# Patient Record
Sex: Female | Born: 1965 | Race: White | Hispanic: No | Marital: Married | Smoking: Never smoker
Health system: Southern US, Community
[De-identification: ages and names within clinical notes are randomized; demographics above are authoritative.]

## PROBLEM LIST (undated history)

## (undated) DIAGNOSIS — I10 Essential (primary) hypertension: Secondary | ICD-10-CM

## (undated) DIAGNOSIS — E785 Hyperlipidemia, unspecified: Secondary | ICD-10-CM

## (undated) DIAGNOSIS — K219 Gastro-esophageal reflux disease without esophagitis: Secondary | ICD-10-CM

## (undated) DIAGNOSIS — I499 Cardiac arrhythmia, unspecified: Secondary | ICD-10-CM

## (undated) DIAGNOSIS — T4145XA Adverse effect of unspecified anesthetic, initial encounter: Secondary | ICD-10-CM

## (undated) DIAGNOSIS — T8859XA Other complications of anesthesia, initial encounter: Secondary | ICD-10-CM

## (undated) HISTORY — DX: Hyperlipidemia, unspecified: E78.5

## (undated) HISTORY — PX: TUBAL LIGATION: SHX77

## (undated) HISTORY — PX: OTHER SURGICAL HISTORY: SHX169

## (undated) HISTORY — DX: Essential (primary) hypertension: I10

## (undated) HISTORY — PX: CARPAL TUNNEL RELEASE: SHX101

## (undated) HISTORY — DX: Gastro-esophageal reflux disease without esophagitis: K21.9

---

## 1988-10-21 HISTORY — PX: OTHER SURGICAL HISTORY: SHX169

## 2007-10-22 HISTORY — PX: BLADDER SUSPENSION: SHX72

## 2008-02-18 ENCOUNTER — Encounter (INDEPENDENT_AMBULATORY_CARE_PROVIDER_SITE_OTHER): Payer: Self-pay | Admitting: Obstetrics and Gynecology

## 2008-02-19 ENCOUNTER — Ambulatory Visit (HOSPITAL_COMMUNITY): Admission: RE | Admit: 2008-02-19 | Discharge: 2008-02-19 | Payer: Self-pay | Admitting: Obstetrics and Gynecology

## 2011-03-05 NOTE — H&P (Signed)
Carrie Brooks, Carrie Brooks            ACCOUNT NO.:  [id number]   MEDICAL RECORD NO.:  [id number]          PATIENT TYPE:  AMB   LOCATION:  SDC                           FACILITY:  [facility]   PHYSICIAN:  Zenaida Niece, M.D.DATE OF BIRTH:  April 29, 1966   DATE OF ADMISSION:  02/19/2008  DATE OF DISCHARGE:                              HISTORY & PHYSICAL   CHIEF COMPLAINT:  Abnormal uterine bleeding and stress urinary  incontinence.   HISTORY AND PHYSICAL:  This is a 45 year old female para 3-0-1-3 who was  referred to me by Verdie Drown.  I first saw her in March of this year.  She complains of recent irregular menses for which she had, in March,  bled for four of the past six weeks with mild to moderate cramping.  She  has tried oral contraceptives in the past and did not tolerate these.  Physical exam did not reveal any significant abnormalities.  She had a  pelvic ultrasound performed which was normal and this included saline  infusion to assess the endometrial cavity.  Endometrial biopsy was  benign but consistent with a possible endometrial polyp.  She also  complains of a urinary urgency and possibly stress urinary incontinence.  Urodynamics were performed in the office which were slightly  inconclusive.  She then saw Dr. Larey Dresser for a consultation. He  did feel that she would benefit from a sling procedure.  This was  discussed with the patient and she would prefer that I perform the  sling.  She is being admitted for hysteroscopy with endometrial ablation  and TVT Secur.   PAST OBSTETRICAL HISTORY:  Three vaginal deliveries at term and one  spontaneous abortion.   PAST MEDICAL HISTORY:  She denies, including no venous thromboembolic  problems.   PAST SURGICAL HISTORY:  1. Bilateral tubal ligation.  2. Cholecystectomy.  3. Elbow surgery.  4. Surgery for carpal tunnel syndrome.   ALLERGIES:  PENICILLIN and KEFLEX.   CURRENT MEDICATIONS:  None.   GYNECOLOGICAL HISTORY:   Remote history of abnormal Pap smear with normal  colposcopy and she had a normal Pap smear in February of this year.  She  denies any sexually-transmitted diseases.   FAMILY HISTORY:  Maternal grandmother with breast cancer.   REVIEW OF SYSTEMS:  No vaginal discharge or lesions, she is sexually  active without problems and has normal bowel function.  No shortness of  breath or chest pain.   PHYSICAL EXAMINATION:  Weight is 232 pounds.  Generally, this is a well-  developed female in no acute distress.  NECK:  Supple without lymphadenopathy or thyromegaly.  LUNGS:  Clear to auscultation.  HEART:  Regular rate and rhythm without murmur.  ABDOMEN:  Soft, nontender, nondistended without palpable masses.  EXTREMITIES:  No edema and are nontender.  PELVIC:  External genitalia is normal.  On speculum exam, the cervix is  normal.  The Pipelle sounded to approximately 9 cm.  On bimanual exam,  she has an anteverted to mid planar uterus that is upper limits of  normal size and nontender and she has no adnexal masses.  ASSESSMENT:  1. Abnormal uterine bleeding.  All nonsurgical and surgical options      have been discussed with the patient.  Ultrasound is normal      although there was possibly a polyp by endometrial biopsy.  She      wishes to proceed with endometrial ablation.  Again, all other      options and risks of surgery have been discussed.  2. Stress urinary incontinence.  My office urodynamics were      inconclusive but Dr. Larey Dresser feels she would benefit from a      sling.  Again this has been discussed with the patient and I      referred for Dr. Vonita Moss to do her sling.  She is electing to have      me due the sling.  Risks of the surgery have been discussed.   PLAN:  Admit the patient on the day of surgery for a hysteroscopy,  endometrial ablation with NovaSure and TVT Secur.      Zenaida Niece, M.D.  Electronically Signed     TDM/MEDQ  D:  02/18/2008   T:  02/18/2008  Job:  [id number]

## 2011-03-05 NOTE — Op Note (Signed)
NAMESERINA, Carrie Brooks            ACCOUNT NO.:  [id number]   MEDICAL RECORD NO.:  [id number]          PATIENT TYPE:  AMB   LOCATION:  SDC                           FACILITY:  [facility]   PHYSICIAN:  Zenaida Niece, M.D.DATE OF BIRTH:  1966/02/22   DATE OF PROCEDURE:  DATE OF DISCHARGE:                               OPERATIVE REPORT   PREOPERATIVE DIAGNOSES:  Abnormal uterine bleeding and stress urinary  incontinence.   POSTOPERATIVE DIAGNOSES:  Abnormal uterine bleeding and stress urinary  incontinence.   PROCEDURE:  Hysteroscopy with D&C, NovaSure endometrial ablation, and  TVT-SECUR.   SURGEON:  Zenaida Niece, MD.   ANESTHESIA:  General endotracheal tube and paracervical block and  vaginal local anesthesia.   FINDINGS:  She had a normal endometrial cavity with some polypoid  endometrium.  The NovaSure device used a cavity length of 4.5 cm, width  of 4.3 cm, and used 106 w for 88 seconds.   SPECIMENS:  Endometrial curettings sent to pathology.   ESTIMATED BLOOD LOSS:  50 mL.   COMPLICATIONS:  None.   PROCEDURE IN DETAIL:  The patient was taken to the operating room and  then placed in the dorsal supine position.  General anesthesia was then  induced.  She was placed in mobile stirrups and legs elevated until her  knees and hips were at 90-degree angles.  Perineum and vagina were then  prepped and draped in the usual sterile fashion and bladder drained with  a latex-free catheter.  A Graves speculum was then inserted into the  vagina.  The anterior lip of the cervix was grasped with a single-tooth  tenaculum.  Paracervical block was then performed with a total of 16 mL  of 2% plain lidocaine.  This was a deep block.  The patient was given  Toradol IV.  Uterus then sounded to 8.5 cm.  The cervix was easily  dilated to a size 19 dilator.  The observer hysteroscope was inserted  and good visualization was achieved.  The uterine fundus appeared  normal, but in the mid body  in the uterus posteriorly there was some  polypoid tissue.  No further lesions were seen.  The hysteroscope was  removed and sharp curettage was performed with return of a small amount  of tissue.  Hysteroscopy was again performed which revealed a normal  endometrial cavity with no significant tissue.  The fluid was evacuated,  and the hysteroscope was removed.  The cervix then measured 4 cm with  the Hegar size 7 dilator.  Cervix was gradually dilated to accommodate a  size 8 Hegar dilator.  The NovaSure device was then inserted and  deployed appropriately.  CO2 test was passed and endometrial ablation  was performed without complications as mentioned above.  The device was  then removed and found to be intact.  The single-tooth tenaculum was  removed and bleeding controlled with pressure.  The Graves speculum was  then removed from the vagina.   Attention was turned to the TVT.  A weighted speculum was inserted into  the vagina.  Allis clamps were used to grab the vaginal  mucosa  approximately 1 fingerbreadth from the urethral meatus in the midline.  This area was then infiltrated with a mixture of 1% plain lidocaine and  0.5% Marcaine with epinephrine.  This was done in the midline and  laterally for hydrodissection along the dissection plane for the TVT.  A  vertical incision was then made in the vaginal mucosa, and the edges  were grasped with the Allis clamps.  Sharp dissection was then used to  dissect the vaginal mucosa laterally to the pubic ramus.  This was done  bilaterally.  A knife handle was able to be passed to the pubic ramus on  each side.  The TVT-SECUR device was then inserted first on the  patient's right side and then on the left side with good insertion.  A  right-angled clamp was able to be passed between the urethra and the  sling, which was well visualized.  Cystoscopy was then performed, which  revealed a normal bladder and no evidence of injury to the bladder  or  the urethra.  Cystoscope was removed.  The needles on the TVT-SECUR  device were then removed without difficulty.  The vaginal incision was  closed with running locking 2-0 Vicryl.  Bleeding from the distal end of  this incision was controlled with figure-of-eight sutures of 2-0 Vicryl.  This achieved good closure and adequate hemostasis.  All instruments  were then removed from the vagina.  The bladder was then drained of  approximately 300 mL of clear urine.  The patient was taken down from  stirrups.  She was extubated in the operating room taken to the recovery  room in stable condition after tolerating the procedure well.  Counts  were correct x2, she received Cipro 200 mg IV at the beginning of the  procedure and had PAS hose on throughout the procedure.      Zenaida Niece, M.D.  Electronically Signed     TDM/MEDQ  D:  02/19/2008  T:  02/19/2008  Job:  [id number]

## 2011-07-16 LAB — CBC
HCT: 40.8
Platelets: 232
WBC: 10

## 2011-07-16 LAB — PREGNANCY, URINE: Preg Test, Ur: NEGATIVE

## 2014-03-22 ENCOUNTER — Ambulatory Visit (INDEPENDENT_AMBULATORY_CARE_PROVIDER_SITE_OTHER): Payer: Self-pay | Admitting: General Surgery

## 2014-03-31 ENCOUNTER — Ambulatory Visit (INDEPENDENT_AMBULATORY_CARE_PROVIDER_SITE_OTHER): Payer: Managed Care, Other (non HMO) | Admitting: General Surgery

## 2014-03-31 ENCOUNTER — Encounter (INDEPENDENT_AMBULATORY_CARE_PROVIDER_SITE_OTHER): Payer: Self-pay | Admitting: General Surgery

## 2014-03-31 DIAGNOSIS — K219 Gastro-esophageal reflux disease without esophagitis: Secondary | ICD-10-CM

## 2014-03-31 DIAGNOSIS — E785 Hyperlipidemia, unspecified: Secondary | ICD-10-CM

## 2014-03-31 DIAGNOSIS — I10 Essential (primary) hypertension: Secondary | ICD-10-CM

## 2014-03-31 NOTE — Progress Notes (Signed)
Subjective:   morbid obesity  Patient ID: Carrie Brooks, female   DOB: Oct 01, 1966, 48 y.o.   MRN: [id number]  HPI Carrie Bellowngela C McMasters48 y.o.female referred by Dr. Tomasa BlaseSchultz for surgical treatment for morbid obesity.  She  gives a history of progressive obesity since early adulthood despite multiple attempts at medical management. She has been able to lose up to 70 pounds at a time in the past but didn't experience is progressive weight regain. In recent years she has had a harder time getting the weight off.  her weight has been affecting her in a number of ways including inability to enjoy routine activities with her family.  She also is concerned about her long-term health as she has developed medical problems related to her weight including hypertension and elevated cholesterol and some evidence of fatty liver..   She has been to our initial information seminar, researched surgical options thoroughly and is interested in sleeve gastrectomy. She does have some mild reflux occasionally controlled with over-the-counter medications.  Past Medical History  Diagnosis Date  . GERD (gastroesophageal reflux disease)   . Hyperlipidemia   . Hypertension    Past Surgical History  Procedure Laterality Date  . Bladder suspension  2009  . Carpal tunnel release Bilateral 2004 & 2005  . Gallbladder  1990  . Tubal ligation     Current Outpatient Prescriptions  Medication Sig Dispense Refill  . carvedilol (COREG) 25 MG tablet       . cetirizine (ZYRTEC) 10 MG tablet Take 10 mg by mouth daily.      . cholecalciferol (VITAMIN D) 1000 UNITS tablet Take 5,000 Units by mouth daily.      . Prenatal Vit-Fe Fumarate-FA (MULTIVITAMIN-PRENATAL) 27-0.8 MG TABS tablet Take 1 tablet by mouth daily at 12 noon.       No current facility-administered medications for this visit.   Allergies  Allergen Reactions  . Penicillins Hives   History  Substance Use Topics  . Smoking status: Never Smoker   . Smokeless  tobacco: Not on file  . Alcohol Use: No       Review of Systems  Constitutional: Positive for fatigue.  Respiratory: Negative.   Cardiovascular: Positive for palpitations. Negative for chest pain and leg swelling.  Gastrointestinal: Negative.   Genitourinary: Negative.   Musculoskeletal: Negative.   Psychiatric/Behavioral: Negative.        Objective:   Physical Exam BP 135/100  Pulse 82  Temp(Src) 98.6 F (37 C)  Resp 16  Ht 5\' 3"  (1.6 m)  Wt 236 lb (107.049 kg)  BMI 41.82 kg/m2 General: Alert, obese Caucasian female, in no distress Skin: Warm and dry without rash or infection. HEENT: No palpable masses or thyromegaly. Sclera nonicteric. Pupils equal round and reactive. Oropharynx clear. Lymph nodes: No cervical, supraclavicular, or inguinal nodes palpable. Lungs: Breath sounds clear and equal without increased work of breathing Cardiovascular: Regular rate and rhythm without murmur. No JVD or edema. Peripheral pulses intact. Abdomen: Nondistended. Soft and nontender. No masses palpable. No organomegaly. No palpable hernias. Well-healed Coker incision without hernias Extremities: No edema or joint swelling or deformity. No chronic venous stasis changes. Neurologic: Alert and fully oriented. Gait normal.    Assessment:     Patient with progressive morbid obesity unresponsive to multiple efforts at medical management who presents with a BMI of 41 and comorbidities of hypertension, elevated cholesterol, steatohepatitis and mild GERD.. I believe there would be very significant medical benefit from surgical weight loss. After our discussion  of surgical options currently available the patient has decided to proceed with sleeve gastrectomy as she is concerned about dumping and malabsorption and has known patients who have done very well with the sleeve.. We have discussed the nature of the problem and the risks of remaining morbidly obese. We discussed laparoscopic sleeve gastric in  detail including the nature of the procedure, expected hospitalization and recovery, and major risks of anesthetic complications, bleeding, blood clots and pulmonary emboli, leakage and infection and long-term risks of stricture, ulceration, bowel obstruction, nausea and the possibility that her reflux could be made worse., and failure to lose weight or weight regain. We discussed these problems could lead to death. The patient was given a complete consent form to review and all questions were answered. We will go ahead with preoperative including psychological and nutrition evaluations, upper GI series and routine lab and x-rays. I will see the patient back following these studies.    Plan:     As above

## 2014-03-31 NOTE — Addendum Note (Signed)
Addended by: Maryan Puls on: 03/31/2014 12:07 PM   Modules accepted: Orders

## 2014-04-07 ENCOUNTER — Ambulatory Visit (INDEPENDENT_AMBULATORY_CARE_PROVIDER_SITE_OTHER): Payer: Self-pay | Admitting: General Surgery

## 2014-04-30 ENCOUNTER — Encounter: Payer: Self-pay | Admitting: Dietician

## 2014-04-30 ENCOUNTER — Encounter: Payer: Managed Care, Other (non HMO) | Attending: General Surgery | Admitting: Dietician

## 2014-04-30 DIAGNOSIS — Z713 Dietary counseling and surveillance: Secondary | ICD-10-CM | POA: Insufficient documentation

## 2014-04-30 DIAGNOSIS — Z01818 Encounter for other preprocedural examination: Secondary | ICD-10-CM | POA: Insufficient documentation

## 2014-04-30 NOTE — Progress Notes (Signed)
  Pre-Op Assessment Visit:  Pre-Operative Sleeve Gastrectomy Surgery  Medical Nutrition Therapy:  Appt start time: 1115   End time:  1145.  Patient was seen on 04/30/2014 for Pre-Operative Sleeve Gastrectomy Nutrition Assessment. Assessment [facility] on 04/30/2014.   Preferred Learning Style:   No preference indicated   Learning Readiness:   Ready  Handouts given during visit include:  Pre-Op Goals Bariatric Surgery Protein Shakes  Teaching Method Utilized:  Visual Auditory Hands on  Barriers to learning/adherence to lifestyle change: none  Demonstrated degree of understanding via:  Teach Back   Patient to call the [facility] to enroll in Pre-Op and Post-Op Nutrition Education when surgery date is scheduled.

## 2014-04-30 NOTE — Patient Instructions (Signed)
Follow Pre-Op Goals Try Protein Shakes  

## 2014-05-03 ENCOUNTER — Encounter (INDEPENDENT_AMBULATORY_CARE_PROVIDER_SITE_OTHER): Payer: Self-pay

## 2014-05-03 ENCOUNTER — Ambulatory Visit (HOSPITAL_COMMUNITY)
Admission: RE | Admit: 2014-05-03 | Discharge: 2014-05-03 | Disposition: A | Discharge status: Home / Self Care | Payer: Managed Care, Other (non HMO) | Source: Ambulatory Visit | Attending: General Surgery | Admitting: General Surgery

## 2014-05-03 ENCOUNTER — Other Ambulatory Visit: Payer: Self-pay

## 2014-05-03 DIAGNOSIS — J984 Other disorders of lung: Secondary | ICD-10-CM | POA: Insufficient documentation

## 2014-05-03 DIAGNOSIS — E785 Hyperlipidemia, unspecified: Secondary | ICD-10-CM | POA: Insufficient documentation

## 2014-05-03 DIAGNOSIS — K219 Gastro-esophageal reflux disease without esophagitis: Secondary | ICD-10-CM | POA: Insufficient documentation

## 2014-05-03 DIAGNOSIS — K449 Diaphragmatic hernia without obstruction or gangrene: Secondary | ICD-10-CM | POA: Insufficient documentation

## 2014-05-03 DIAGNOSIS — I10 Essential (primary) hypertension: Secondary | ICD-10-CM

## 2014-05-03 DIAGNOSIS — Z9089 Acquired absence of other organs: Secondary | ICD-10-CM | POA: Insufficient documentation

## 2014-05-03 DIAGNOSIS — Z6841 Body Mass Index (BMI) 40.0 and over, adult: Secondary | ICD-10-CM | POA: Insufficient documentation

## 2014-05-03 DIAGNOSIS — R918 Other nonspecific abnormal finding of lung field: Secondary | ICD-10-CM | POA: Insufficient documentation

## 2014-05-04 NOTE — Progress Notes (Signed)
  Pre-Operative Nutrition Class:  Appt start time: 9539   End time:  1830.  Patient was seen on 05/02/14 for Pre-Operative Bariatric Surgery Education at the [facility].   Surgery date:  Surgery type: Gastric sleeve Start weight at [facility]: 242 lbs on 04/30/2014 Weight today: 241.5 lbs  TANITA  BODY COMP RESULTS  05/02/14   BMI (kg/m^2) 42.8   Fat Mass (lbs) 118   Fat Free Mass (lbs) 123.5   Total Body Water (lbs) 90.5   Samples given per MNT protocol. Patient educated on appropriate usage: Premier protein shake (strawberry - qty 1) Lot #: [id number] Exp: 02/2015  Bariatric Advantage Calcium Citrate (chocolate - qty 1) Lot #: [id number] Exp: 03/2015  Celebrate Vitamins Multivitamin (orange - qty 1) Lot #: [id number] Exp: 11/2014  Renee Pain Protein Powder (vanilla - qty 1) Lot #: [id number] Exp: 07/2015  The following the learning objectives were met by the patient during this course:  Identify Pre-Op Dietary Goals and will begin 2 weeks pre-operatively  Identify appropriate sources of fluids and proteins   State protein recommendations and appropriate sources pre and post-operatively  Identify Post-Operative Dietary Goals and will follow for 2 weeks post-operatively  Identify appropriate multivitamin and calcium sources  Describe the need for physical activity post-operatively and will follow MD recommendations  State when to call healthcare provider regarding medication questions or post-operative complications  Handouts given during class include:  Pre-Op Bariatric Surgery Diet Handout  Protein Shake Handout  Post-Op Bariatric Surgery Nutrition Handout  BELT Program Information Flyer  Support Group Information Flyer  [facility] Bariatric Supplements Price List  Follow-Up Plan: Patient will follow-up at [facility] 2 weeks post operatively for diet advancement per MD.

## 2014-07-01 ENCOUNTER — Encounter (HOSPITAL_COMMUNITY): Payer: Self-pay | Admitting: Pharmacy Technician

## 2014-07-01 NOTE — Progress Notes (Signed)
Please put orders in Epic surgery 07-12-14 pre op 07-08-14 Thanks

## 2014-07-04 NOTE — Progress Notes (Signed)
Please put orders in Epic surgery 07-12-14 pre op 07-08-14 Thanks 

## 2014-07-06 NOTE — Progress Notes (Signed)
Please place orders in EPIC.  Surgery on 07/12/2014.  Preop on 07/08/14 at 0930am.  Thank You.

## 2014-07-06 NOTE — Patient Instructions (Addendum)
Carrie Brooks  07/06/2014   Your procedure is scheduled on:  07/12/2014    Report to [facility].  Follow the Signs to Short Stay Center at    1145    am  Call this number if you have problems the morning of surgery: [phone]   Remember:   Do not eat food after midnite. May have clear liquids until 0700am then npo.    Take these medicines the morning of surgery with A SIP OF WATER: coreg, Zyrtec    Do not wear jewelry, make-up or nail polish.  Do not wear lotions, powders, or perfumes, deodorant.  Do not shave 48 hours prior to surgery.   Do not bring valuables to the hospital.  Contacts, dentures or bridgework may not be worn into surgery.  Leave suitcase in the car. After surgery it may be brought to your room.  For patients admitted to the hospital, checkout time is 11:00 AM the day of  discharge.        Please read over the following fact sheets that you were given:   CLEAR LIQUID DIET   Foods Allowed                                                                     Foods Excluded  Coffee and tea, regular and decaf                             liquids that you cannot  Plain Jell-O in any flavor                                             see through such as: Fruit ices (not with fruit pulp)                                     milk, soups, orange juice  Iced Popsicles                                    All solid food Carbonated beverages, regular and diet                                    Cranberry, grape and apple juices Sports drinks like Gatorade Lightly seasoned clear broth or consume(fat free) Sugar, honey syrup  Sample Menu Breakfast                                Lunch                                     Supper Cranberry juice                    Beef  broth                            Chicken broth Jell-O                                     Grape juice                           Apple juice Coffee or tea                        Jell-O                                       Popsicle                                                Coffee or tea                        Coffee or tea  _____________________________________________________________________  [facility] Before surgery, you can play an important role.  Because skin is not sterile, your skin needs to be as free of germs as possible.  You can reduce the number of germs on your skin by washing with CHG (chlorahexidine gluconate) soap before surgery.  CHG is an antiseptic cleaner which kills germs and bonds with the skin to continue killing germs even after washing. Please DO NOT use if you have an allergy to CHG or antibacterial soaps.  If your skin becomes reddened/irritated stop using the CHG and inform your nurse when you arrive at Short Stay. Do not shave (including legs and underarms) for at least 48 hours prior to the first CHG shower.  You may shave your face/neck. Please follow these instructions carefully:  1.  Shower with CHG Soap the night before surgery and the  morning of Surgery.  2.  If you choose to wash your hair, wash your hair first as usual with your  normal  shampoo.  3.  After you shampoo, rinse your hair and body thoroughly to remove the  shampoo.                           4.  Use CHG as you would any other liquid soap.  You can apply chg directly  to the skin and wash                       Gently with a scrungie or clean washcloth.  5.  Apply the CHG Soap to your body ONLY FROM THE NECK DOWN.   Do not use on face/ open                           Wound or open sores. Avoid contact with eyes, ears mouth and genitals (private parts).                       Wash face,  Genitals (  private parts) with your normal soap.             6.  Wash thoroughly, paying special attention to the area where your surgery  will be performed.  7.  Thoroughly rinse your body with warm water from the neck down.  8.  DO NOT shower/wash with your normal soap after using  and rinsing off  the CHG Soap.                9.  Pat yourself dry with a clean towel.            10.  Wear clean pajamas.            11.  Place clean sheets on your bed the night of your first shower and do not  sleep with pets. Day of Surgery : Do not apply any lotions/deodorants the morning of surgery.  Please wear clean clothes to the hospital/surgery center.  FAILURE TO FOLLOW THESE INSTRUCTIONS MAY RESULT IN THE CANCELLATION OF YOUR SURGERY PATIENT SIGNATURE_________________________________  NURSE SIGNATURE__________________________________  ________________________________________________________________________ , coughing and deep breathing exercises, leg exercises

## 2014-07-08 ENCOUNTER — Encounter (HOSPITAL_COMMUNITY): Payer: Self-pay

## 2014-07-08 ENCOUNTER — Encounter (HOSPITAL_COMMUNITY)
Admission: RE | Admit: 2014-07-08 | Discharge: 2014-07-08 | Disposition: A | Discharge status: Home / Self Care | Payer: Managed Care, Other (non HMO) | Source: Ambulatory Visit | Attending: General Surgery | Admitting: General Surgery

## 2014-07-08 ENCOUNTER — Encounter (INDEPENDENT_AMBULATORY_CARE_PROVIDER_SITE_OTHER): Payer: Self-pay

## 2014-07-08 ENCOUNTER — Other Ambulatory Visit (INDEPENDENT_AMBULATORY_CARE_PROVIDER_SITE_OTHER): Payer: Self-pay | Admitting: General Surgery

## 2014-07-08 DIAGNOSIS — Z01812 Encounter for preprocedural laboratory examination: Secondary | ICD-10-CM | POA: Diagnosis present

## 2014-07-08 DIAGNOSIS — Z6841 Body Mass Index (BMI) 40.0 and over, adult: Secondary | ICD-10-CM | POA: Diagnosis not present

## 2014-07-08 HISTORY — DX: Other complications of anesthesia, initial encounter: T88.59XA

## 2014-07-08 HISTORY — DX: Cardiac arrhythmia, unspecified: I49.9

## 2014-07-08 HISTORY — DX: Adverse effect of unspecified anesthetic, initial encounter: T41.45XA

## 2014-07-08 LAB — CBC
HEMATOCRIT: 41.6 % (ref 36.0–46.0)
Hemoglobin: 13.6 g/dL (ref 12.0–15.0)
MCH: 28.2 pg (ref 26.0–34.0)
MCHC: 32.7 g/dL (ref 30.0–36.0)
MCV: 86.3 fL (ref 78.0–100.0)
Platelets: 234 10*3/uL (ref 150–400)
RBC: 4.82 MIL/uL (ref 3.87–5.11)
RDW: 12.9 % (ref 11.5–15.5)
WBC: 8.2 10*3/uL (ref 4.0–10.5)

## 2014-07-08 LAB — BASIC METABOLIC PANEL
ANION GAP: 13 (ref 5–15)
BUN: 18 mg/dL (ref 6–23)
CO2: 24 mEq/L (ref 19–32)
CREATININE: 0.61 mg/dL (ref 0.50–1.10)
Calcium: 9.5 mg/dL (ref 8.4–10.5)
Chloride: 103 mEq/L (ref 96–112)
GFR calc Af Amer: >90 mL/min (ref >90)
GFR calc non Af Amer: >90 mL/min (ref >90)
Glucose, Bld: 101 mg/dL — ABNORMAL HIGH (ref 70–99)
Potassium: 4.2 mEq/L (ref 3.7–5.3)
Sodium: 140 mEq/L (ref 137–147)

## 2014-07-08 LAB — HCG, SERUM, QUALITATIVE: Preg, Serum: NEGATIVE

## 2014-07-08 NOTE — Progress Notes (Signed)
Holter monitor report on chart- 09/2013 on chart- unremarkable.   Office visit notes- 04/27/2013 and 05/28/2014 on chart from PCP- Dr Tomasa Blase.

## 2014-07-08 NOTE — H&P (Signed)
History of Present Illness Carrie Salina T. Carrie Narez MD; 07/08/2014 12:02 PM) Patient words: Pre op exam, gastric sleeve 07/12/2014 @ [facility].  The patient is a 48 year old female who presents with obesity. Patient with progressive morbid obesity unresponsive to multiple efforts at medical management who presents with a BMI of 41 and comorbidities of hypertension, elevated cholesterol, steatohepatitis and mild GERD. She returns for preop visit prior to planned laparoscopic sleeve gastrectomy.She has successfully completed her preoperative workup. No concerns on psychological or nutrition evaluation. Lab work was unremarkable. Upper GI series showed a "tiny" hiatal hernia but I discussed with her we would look for during surgery and repair if it appeared significant. Other Problems Carrie Brooks; 07/08/2014 11:26 AM) Bladder Problems Cholelithiasis Hypercholesterolemia  Past Surgical History Carrie Brooks; 07/08/2014 11:26 AM) Gallbladder Surgery - Open  Diagnostic Studies History Carrie Brooks; 07/08/2014 11:26 AM) Colonoscopy never Mammogram 1-3 years ago Pap Smear 1-5 years ago  Allergies Carrie Brooks; 07/08/2014 11:28 AM) Cephalosporins Penicillins  Medication History Carrie Brooks; 07/08/2014 11:30 AM) Carvedilol (  Tablet, Oral daily) Active. ALPRAZolam (0.25MG  Tablet, Oral as needed) Active. Prenatal Formula (Oral daily) Active.  Social History Carrie Brooks; 07/08/2014 11:26 AM) Caffeine use Coffee, Tea. No alcohol use No drug use Tobacco use Never smoker.  Family History Carrie Brooks; 07/08/2014 11:26 AM) Arthritis Mother. Bleeding disorder Mother. Cerebrovascular Accident Mother. Colon Polyps Mother. Diabetes Mellitus Sister. Hypertension Father, Mother, Sister.  Pregnancy / Birth History Carrie Brooks; 07/08/2014 11:26 AM) Age at menarche 14 years. Gravida 4 Maternal age 54-20 Para 3     Review of Systems Carrie Brooks; 07/08/2014 11:26 AM) General Not Present- Appetite Loss, Chills, Fatigue, Fever, Night Sweats, Weight Gain and Weight Loss. Skin Not Present- Change in Wart/Brooks, Dryness, Hives, Jaundice, New Lesions, Non-Healing Wounds, Rash and Ulcer. HEENT Present- Seasonal Allergies. Not Present- Earache, Hearing Loss, Hoarseness, Nose Bleed, Oral Ulcers, Ringing in the Ears, Sinus Pain, Sore Throat, Visual Disturbances, Wears glasses/contact lenses and Yellow Eyes. Respiratory Not Present- Bloody sputum, Chronic Cough, Difficulty Breathing, Snoring and Wheezing. Breast Not Present- Breast Mass, Breast Pain, Nipple Discharge and Skin Changes. Cardiovascular Not Present- Chest Pain, Difficulty Breathing Lying Down, Leg Cramps, Palpitations, Rapid Heart Rate, Shortness of Breath and Swelling of Extremities. Gastrointestinal Not Present- Abdominal Pain, Bloating, Bloody Stool, Change in Bowel Habits, Chronic diarrhea, Constipation, Difficulty Swallowing, Excessive gas, Gets full quickly at meals, Hemorrhoids, Indigestion, Nausea, Rectal Pain and Vomiting. Female Genitourinary Not Present- Frequency, Nocturia, Painful Urination, Pelvic Pain and Urgency. Musculoskeletal Not Present- Back Pain, Joint Pain, Joint Stiffness, Muscle Pain, Muscle Weakness and Swelling of Extremities. Neurological Not Present- Decreased Memory, Fainting, Headaches, Numbness, Seizures, Tingling, Tremor, Trouble walking and Weakness. Psychiatric Not Present- Anxiety, Bipolar, Change in Sleep Pattern, Depression, Fearful and Frequent crying. Endocrine Not Present- Cold Intolerance, Excessive Hunger, Hair Changes, Heat Intolerance, Hot flashes and New Diabetes. Hematology Not Present- Easy Bruising, Excessive bleeding, Gland problems, HIV and Persistent Infections.  Vitals Carrie Contras Carrie Brooks; 07/08/2014 11:27 AM) 07/08/2014 11:27 AM Weight: 232.38 lb Height: 63in Body Surface Area: 2.16 m Body Mass Index: 41.16 kg/m Temp.:  98.80F(Oral)  Pulse: 84 (Regular)  Resp.: 16 (Unlabored)  BP: 136/82 (Sitting, Left Arm, Standard)     Physical Exam Carrie Salina T. Carrie Mole MD; 07/08/2014 12:03 PM)  The physical exam findings are as follows: Note:General: Alert, Morbidly obese Caucasian female, in no distress Skin: Warm and dry without rash or infection. HEENT: No palpable masses or thyromegaly. Sclera nonicteric. Pupils equal round and reactive. Lymph nodes: No  cervical, supraclavicular, or inguinal nodes palpable. Lungs: Breath sounds clear and equal. No wheezing or increased work of breathing. Cardiovascular: Regular rate and rhythm without murmer. No JVD or edema. Peripheral pulses intact. No carotid bruits. Abdomen: Nondistended. Soft and nontender. No masses palpable. No organomegaly. No palpable hernias. Extremities: No edema or joint swelling or deformity. No chronic venous stasis changes. Neurologic: Alert and fully oriented. Gait normal. No focal weakness. Psychiatric: Normal mood and affect. Thought content appropriate with normal judgement and insight    Assessment & Plan Carrie Salina T. Carrie Klett MD; 07/08/2014 12:04 PM)  MORBID OBESITY (278.01  E66.01) Impression: ready to proceed with planned laparoscopic sleeve gastrectomy. She is given a prescription for postoperative pain medication. We reviewed the procedure again in detail and consent form and all her questions were answered.  Current Plans Started OxyCODONE HCl /5ML, 5-10 Milliliter every four hours, as needed, 200 Milliliter, 07/08/2014, No Refill.

## 2014-07-08 NOTE — Progress Notes (Signed)
Requested by fax from office of Dr Tomasa Blase- LOV and holter monitor report.

## 2014-07-11 MED ORDER — LEVOFLOXACIN IN D5W 750 MG/150ML IV SOLN
750.0000 mg | INTRAVENOUS | Status: DC
  Filled 2014-07-11: qty 150

## 2014-07-12 ENCOUNTER — Encounter (HOSPITAL_COMMUNITY)
Admission: RE | Disposition: A | Discharge status: Home / Self Care | Payer: Self-pay | Source: Ambulatory Visit | Attending: General Surgery

## 2014-07-12 ENCOUNTER — Encounter (HOSPITAL_COMMUNITY): Payer: Self-pay | Admitting: Anesthesiology

## 2014-07-12 ENCOUNTER — Ambulatory Visit (HOSPITAL_COMMUNITY)
Admission: RE | Admit: 2014-07-12 | Discharge: 2014-07-12 | Disposition: A | Discharge status: Home / Self Care | Payer: Managed Care, Other (non HMO) | Source: Ambulatory Visit | Attending: General Surgery | Admitting: General Surgery

## 2014-07-12 DIAGNOSIS — K7689 Other specified diseases of liver: Secondary | ICD-10-CM | POA: Diagnosis not present

## 2014-07-12 DIAGNOSIS — I10 Essential (primary) hypertension: Secondary | ICD-10-CM | POA: Diagnosis not present

## 2014-07-12 DIAGNOSIS — Z880 Allergy status to penicillin: Secondary | ICD-10-CM | POA: Diagnosis not present

## 2014-07-12 DIAGNOSIS — E7800 Pure hypercholesterolemia, unspecified: Secondary | ICD-10-CM | POA: Diagnosis not present

## 2014-07-12 DIAGNOSIS — Z538 Procedure and treatment not carried out for other reasons: Secondary | ICD-10-CM | POA: Insufficient documentation

## 2014-07-12 DIAGNOSIS — K219 Gastro-esophageal reflux disease without esophagitis: Secondary | ICD-10-CM | POA: Insufficient documentation

## 2014-07-12 DIAGNOSIS — Z6841 Body Mass Index (BMI) 40.0 and over, adult: Secondary | ICD-10-CM | POA: Insufficient documentation

## 2014-07-12 SURGERY — GASTRECTOMY, SLEEVE, LAPAROSCOPIC
Anesthesia: General

## 2014-07-12 MED ORDER — PROPOFOL 10 MG/ML IV BOLUS
INTRAVENOUS | Status: AC
  Filled 2014-07-12: qty 20

## 2014-07-12 MED ORDER — FENTANYL CITRATE 0.05 MG/ML IJ SOLN
INTRAMUSCULAR | Status: AC
  Filled 2014-07-12: qty 5

## 2014-07-12 MED ORDER — NEOSTIGMINE METHYLSULFATE 10 MG/10ML IV SOLN
INTRAVENOUS | Status: AC
  Filled 2014-07-12: qty 1

## 2014-07-12 MED ORDER — HEPARIN SODIUM (PORCINE) 5000 UNIT/ML IJ SOLN
5000.0000 [IU] | INTRAMUSCULAR | Status: DC
  Filled 2014-07-12: qty 1

## 2014-07-12 MED ORDER — GLYCOPYRROLATE 0.2 MG/ML IJ SOLN
INTRAMUSCULAR | Status: AC
  Filled 2014-07-12: qty 3

## 2014-07-12 MED ORDER — CHLORHEXIDINE GLUCONATE CLOTH 2 % EX PADS: 6.0000 | MEDICATED_PAD | Freq: Once | CUTANEOUS | Status: DC

## 2014-07-12 MED ORDER — SCOPOLAMINE 1 MG/3DAYS TD PT72
1.0000 | MEDICATED_PATCH | TRANSDERMAL | Status: DC
  Filled 2014-07-12: qty 1

## 2014-07-12 MED ORDER — MIDAZOLAM HCL 2 MG/2ML IJ SOLN
INTRAMUSCULAR | Status: AC
  Filled 2014-07-12: qty 2

## 2014-07-12 MED ORDER — DEXAMETHASONE SODIUM PHOSPHATE 10 MG/ML IJ SOLN
INTRAMUSCULAR | Status: AC
  Filled 2014-07-12: qty 1

## 2014-07-12 MED ORDER — ONDANSETRON HCL 4 MG/2ML IJ SOLN
INTRAMUSCULAR | Status: AC
  Filled 2014-07-12: qty 2

## 2014-07-12 MED ORDER — CISATRACURIUM BESYLATE 20 MG/10ML IV SOLN
INTRAVENOUS | Status: AC
  Filled 2014-07-12: qty 10

## 2014-07-12 NOTE — Anesthesia Preprocedure Evaluation (Deleted)
Anesthesia Evaluation  Patient identified by MRN, date of birth, ID band Patient awake    Reviewed: Allergy & Precautions, H&P , NPO status , Patient's Chart, lab work & pertinent test results  Airway       Dental   Pulmonary          Cardiovascular hypertension, Pt. on medications  EKG wnl   Neuro/Psych    GI/Hepatic GERD-  Controlled,  Endo/Other    Renal/GU      Musculoskeletal   Abdominal   Peds  Hematology   Anesthesia Other Findings   Reproductive/Obstetrics PG neg 4 days ago                         Anesthesia Physical Anesthesia Plan  ASA: III  Anesthesia Plan: General   Post-op Pain Management:    Induction: Intravenous  Airway Management Planned: Oral ETT  Additional Equipment:   Intra-op Plan:   Post-operative Plan:   Informed Consent: I have reviewed the patients History and Physical, chart, labs and discussed the procedure including the risks, benefits and alternatives for the proposed anesthesia with the patient or authorized representative who has indicated his/her understanding and acceptance.     Plan Discussed with:   Anesthesia Plan Comments: (Check hepatic panel if done, multinodal pain rx (tylenol, precedex, toradol, decadron,lidocaine, ketamine ?))        Anesthesia Quick Evaluation

## 2014-07-12 NOTE — Progress Notes (Signed)
Patient came in crying and extremely apprehensive. Stated she wasn't sure if she was going to stay and have surgery or not. Obtained VS and gave patient a moment to think about it. Made her aware we weren't going to make her stay and do the surgery. Explained this was  A major life change and she needed to feel comfortable with it. After some thought, and more crying she decided she wasn't going to stay.

## 2014-07-26 ENCOUNTER — Ambulatory Visit: Payer: Managed Care, Other (non HMO)

## 2014-11-03 ENCOUNTER — Encounter (HOSPITAL_COMMUNITY): Payer: Self-pay | Admitting: General Surgery

## 2016-02-11 IMAGING — CR DG CHEST 2V
2 series · 2 of 2 positions shown · non-contrast
Comparison: 01/26/2008

CLINICAL DATA: Preop weight loss surgery.

EXAM:
CHEST  2 VIEW

[view not recorded (1 of 2)]
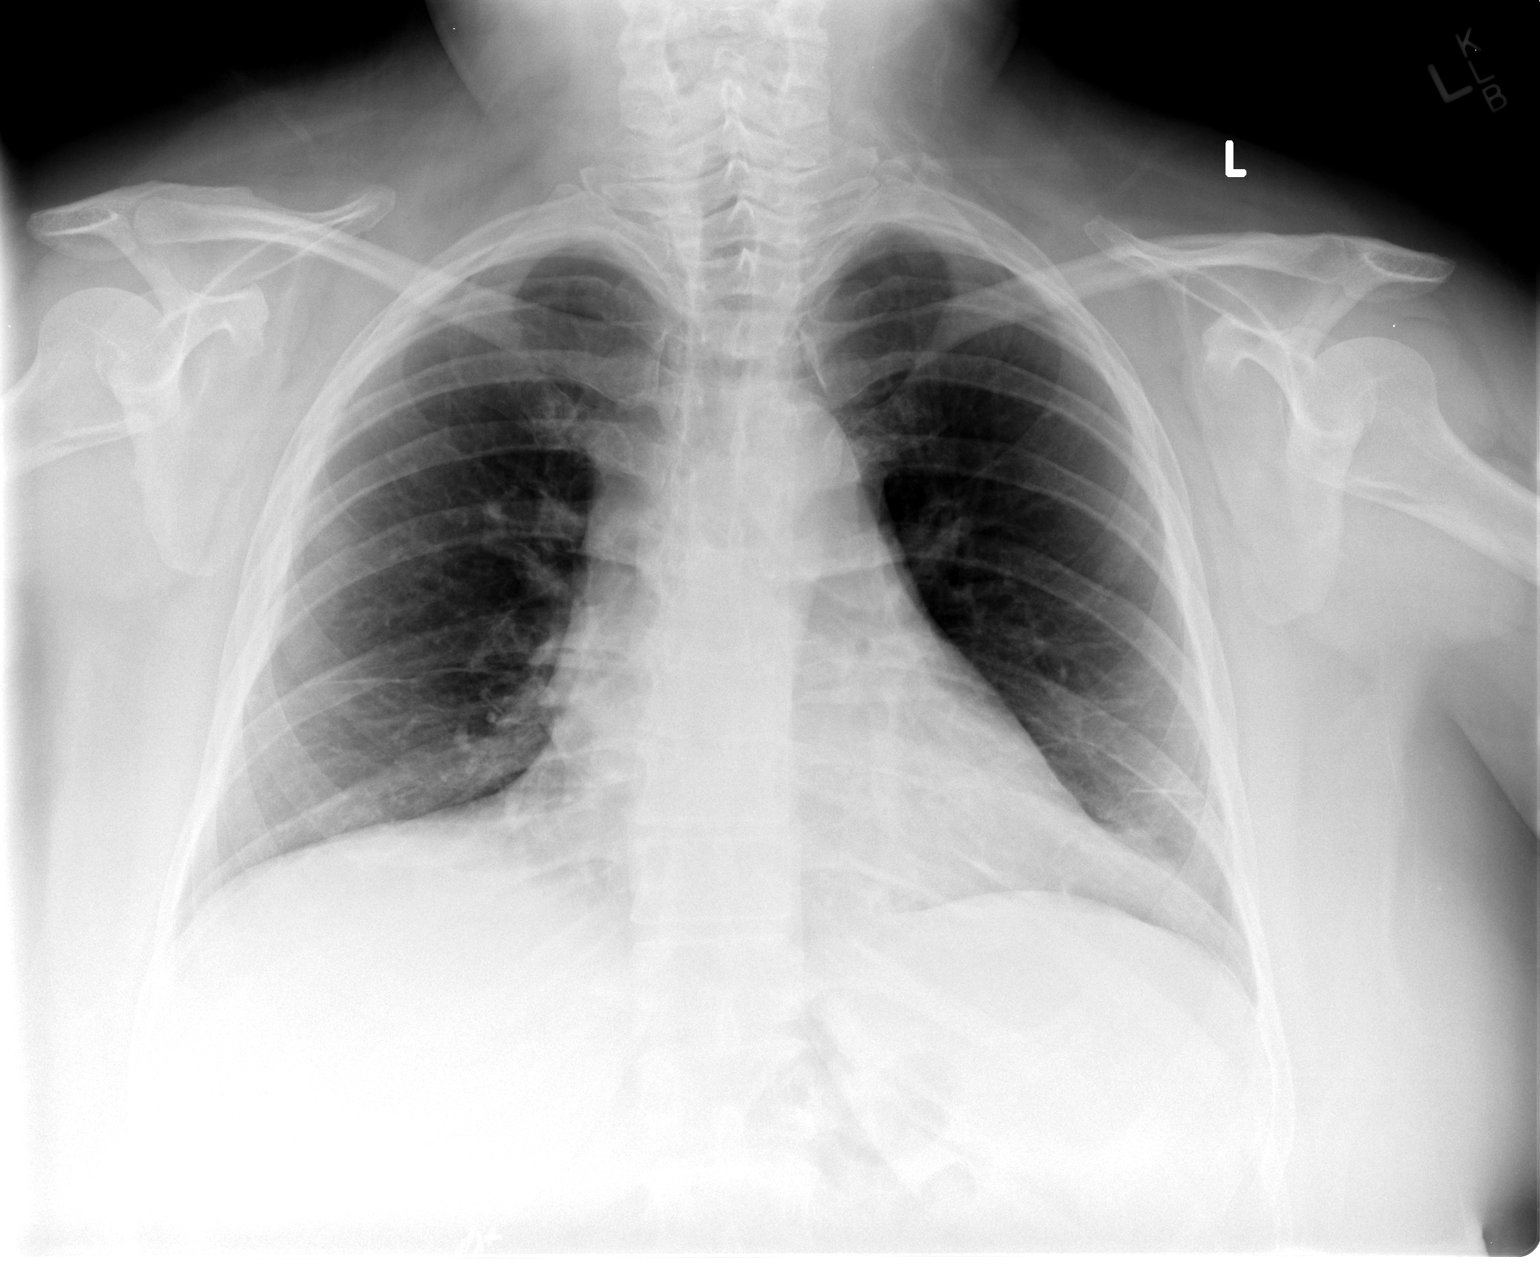

[view not recorded (2 of 2)]
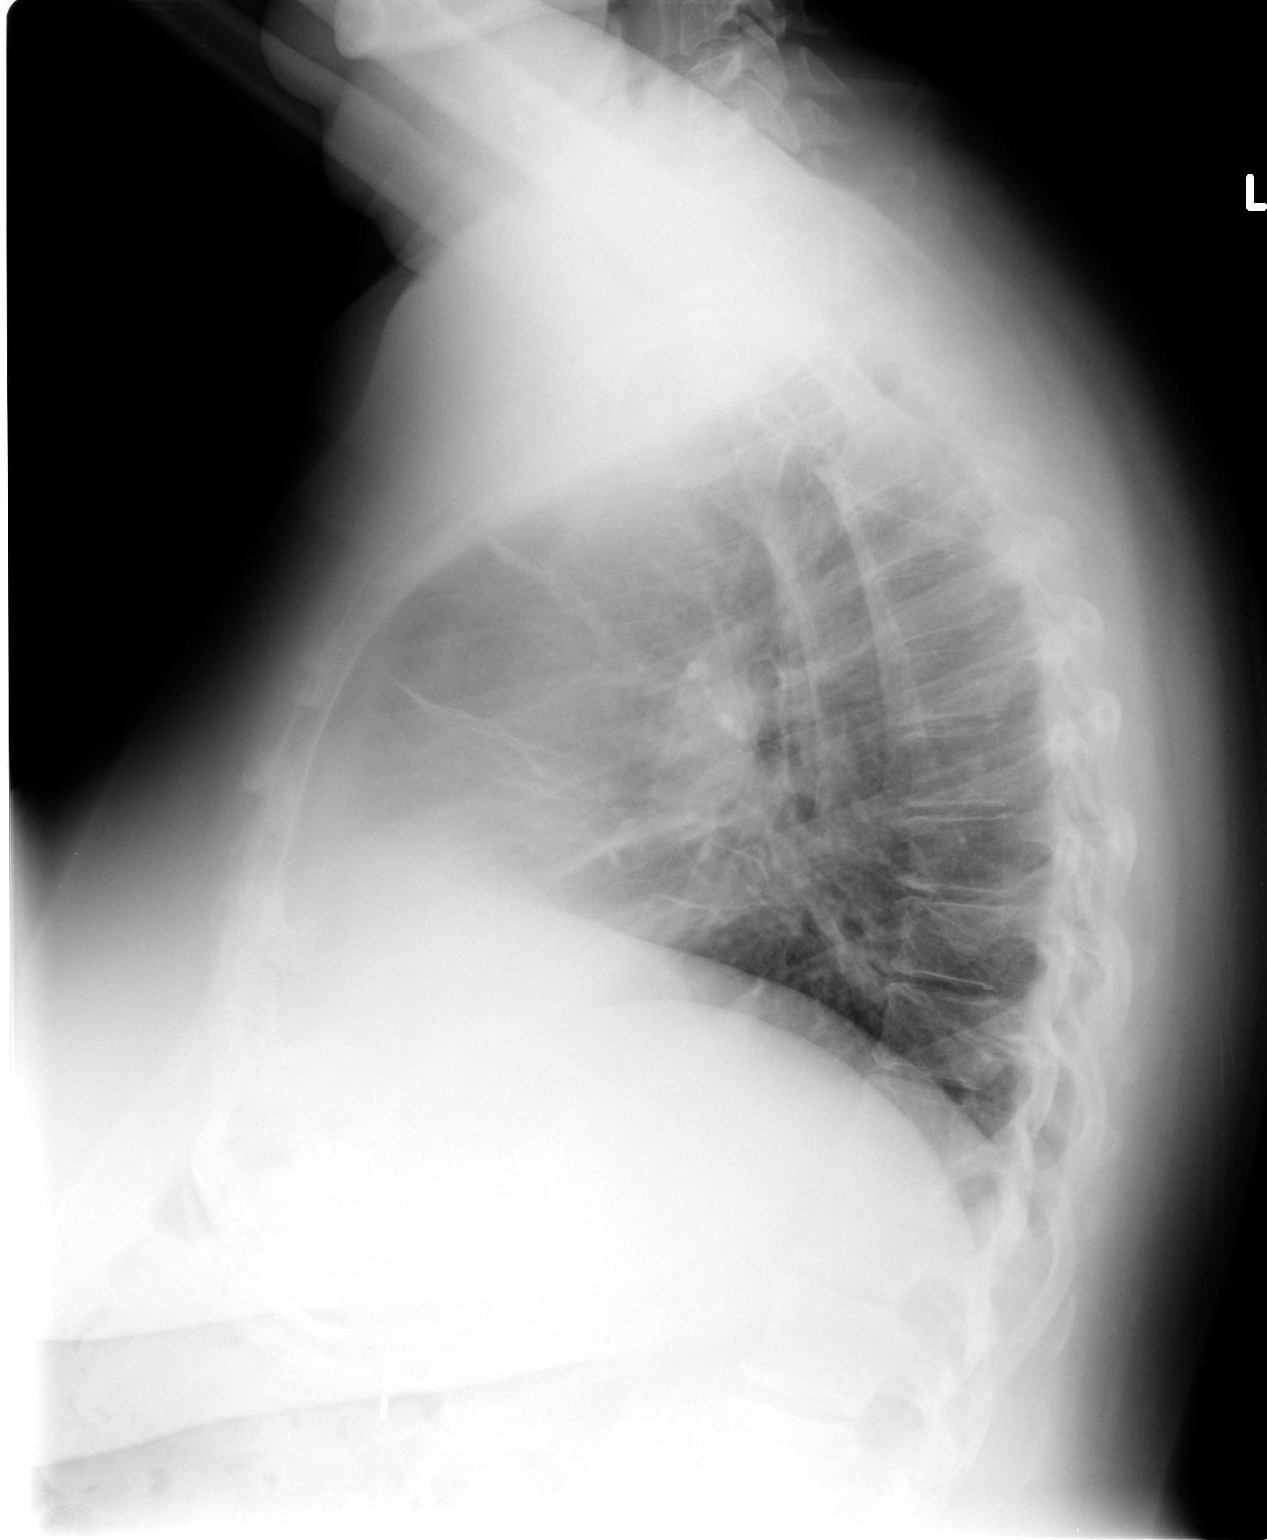

[2 of 2 positions shown; findings below may reference images not displayed]

FINDINGS: Lungs are hypoinflated effusion. Minimal linear scarring is stable.
Cardiomediastinal silhouette and remainder of the exam is unchanged.
IMPRESSION: Hypoinflation without acute cardiopulmonary disease.

## 2018-05-11 ENCOUNTER — Encounter: Payer: Self-pay | Admitting: Gastroenterology

## 2018-06-16 ENCOUNTER — Encounter: Payer: Self-pay | Admitting: Gastroenterology

## 2018-06-26 ENCOUNTER — Encounter

## 2018-06-26 ENCOUNTER — Ambulatory Visit: Payer: Managed Care, Other (non HMO) | Admitting: Gastroenterology

## 2021-04-26 DIAGNOSIS — B9689 Other specified bacterial agents as the cause of diseases classified elsewhere: Secondary | ICD-10-CM | POA: Diagnosis not present

## 2021-04-26 DIAGNOSIS — J019 Acute sinusitis, unspecified: Secondary | ICD-10-CM | POA: Diagnosis not present

## 2021-04-26 DIAGNOSIS — J309 Allergic rhinitis, unspecified: Secondary | ICD-10-CM | POA: Diagnosis not present

## 2021-06-19 DIAGNOSIS — Z6826 Body mass index (BMI) 26.0-26.9, adult: Secondary | ICD-10-CM | POA: Diagnosis not present

## 2021-06-19 DIAGNOSIS — R300 Dysuria: Secondary | ICD-10-CM | POA: Diagnosis not present

## 2021-06-19 DIAGNOSIS — N390 Urinary tract infection, site not specified: Secondary | ICD-10-CM | POA: Diagnosis not present

## 2021-07-02 DIAGNOSIS — K909 Intestinal malabsorption, unspecified: Secondary | ICD-10-CM | POA: Diagnosis not present

## 2021-07-02 DIAGNOSIS — R112 Nausea with vomiting, unspecified: Secondary | ICD-10-CM | POA: Diagnosis not present

## 2021-07-02 DIAGNOSIS — Z9884 Bariatric surgery status: Secondary | ICD-10-CM | POA: Diagnosis not present

## 2021-07-19 DIAGNOSIS — Z9884 Bariatric surgery status: Secondary | ICD-10-CM | POA: Diagnosis not present

## 2021-07-19 DIAGNOSIS — K909 Intestinal malabsorption, unspecified: Secondary | ICD-10-CM | POA: Diagnosis not present

## 2021-07-31 DIAGNOSIS — L720 Epidermal cyst: Secondary | ICD-10-CM | POA: Diagnosis not present

## 2021-07-31 DIAGNOSIS — L820 Inflamed seborrheic keratosis: Secondary | ICD-10-CM | POA: Diagnosis not present

## 2021-10-10 DIAGNOSIS — E65 Localized adiposity: Secondary | ICD-10-CM | POA: Diagnosis not present

## 2021-10-10 DIAGNOSIS — M793 Panniculitis, unspecified: Secondary | ICD-10-CM | POA: Diagnosis not present

## 2021-10-10 DIAGNOSIS — R634 Abnormal weight loss: Secondary | ICD-10-CM | POA: Diagnosis not present

## 2021-10-10 DIAGNOSIS — N6481 Ptosis of breast: Secondary | ICD-10-CM | POA: Diagnosis not present

## 2021-11-19 DIAGNOSIS — R0981 Nasal congestion: Secondary | ICD-10-CM | POA: Diagnosis not present

## 2021-11-19 DIAGNOSIS — J329 Chronic sinusitis, unspecified: Secondary | ICD-10-CM | POA: Diagnosis not present

## 2021-12-04 DIAGNOSIS — J0100 Acute maxillary sinusitis, unspecified: Secondary | ICD-10-CM | POA: Diagnosis not present

## 2022-01-18 ENCOUNTER — Institutional Professional Consult (permissible substitution): Payer: Managed Care, Other (non HMO) | Admitting: Plastic Surgery

## 2022-03-22 DIAGNOSIS — J069 Acute upper respiratory infection, unspecified: Secondary | ICD-10-CM | POA: Diagnosis not present

## 2022-03-22 DIAGNOSIS — H9202 Otalgia, left ear: Secondary | ICD-10-CM | POA: Diagnosis not present

## 2022-04-02 ENCOUNTER — Ambulatory Visit: Payer: BC Managed Care – PPO | Admitting: Plastic Surgery

## 2022-04-02 ENCOUNTER — Encounter: Payer: Self-pay | Admitting: Plastic Surgery

## 2022-04-02 DIAGNOSIS — R21 Rash and other nonspecific skin eruption: Secondary | ICD-10-CM

## 2022-04-02 DIAGNOSIS — M793 Panniculitis, unspecified: Secondary | ICD-10-CM | POA: Diagnosis not present

## 2022-04-02 DIAGNOSIS — M546 Pain in thoracic spine: Secondary | ICD-10-CM | POA: Diagnosis not present

## 2022-04-02 DIAGNOSIS — N62 Hypertrophy of breast: Secondary | ICD-10-CM | POA: Insufficient documentation

## 2022-04-02 DIAGNOSIS — G8929 Other chronic pain: Secondary | ICD-10-CM

## 2022-04-02 DIAGNOSIS — M542 Cervicalgia: Secondary | ICD-10-CM | POA: Diagnosis not present

## 2022-04-02 DIAGNOSIS — M549 Dorsalgia, unspecified: Secondary | ICD-10-CM | POA: Insufficient documentation

## 2022-04-02 NOTE — Progress Notes (Addendum)
Patient ID: Carrie Brooks, female    DOB: 1966/09/02, 56 y.o.   MRN: [id number]   Chief Complaint  Patient presents with   Consult    The patient is a 56 yrs old female here for evaluation of her abdomen.  She was over 200 pounds and lost 130 pounds.  She is 5 feet tall and weighs 131 pounds.  She complains of upper back pain and rashes.  She had gastric sleeve and bypass surgery in 2021 and her weight has been stable for the past year.  She also had a cholecystectomy and tubal ligation.  She is interested in a panniculectomy and/or abdominoplasty.  She has good muscle strength of her abdomen.  She is also interested in a thigh lift and is aware that is not covered by insurance.     Review of Systems  Constitutional:  Positive for activity change.  HENT: Negative.    Eyes: Negative.   Respiratory: Negative.  Negative for chest tightness.   Cardiovascular: Negative.  Negative for leg swelling.  Gastrointestinal: Negative.   Endocrine: Negative.   Genitourinary: Negative.   Musculoskeletal:  Positive for back pain and neck pain.  Skin:  Positive for rash.  Hematological: Negative.     Past Medical History:  Diagnosis Date   Complication of anesthesia    patient having shaking for 2 months after ablation surgery in 2009    Dysrhythmia    tachycardia, palpitations    GERD (gastroesophageal reflux disease)    Hyperlipidemia    Hypertension     Past Surgical History:  Procedure Laterality Date   BLADDER SUSPENSION  2009   uterine ablation   CARPAL TUNNEL RELEASE Bilateral 2004 & 2005   gallbladder  1990   transulnar nerve transposition   2002 adn 2003    bilateral    TUBAL LIGATION        Current Outpatient Medications:    cetirizine (ZYRTEC) 10 MG tablet, Take 10 mg by mouth every morning. , Disp: , Rfl:    Cholecalciferol 1.25 MG (50000 UT) TABS, Take by mouth., Disp: , Rfl:    MULTIPLE VITAMIN-FOLIC ACID PO, Take by mouth., Disp: , Rfl:    Objective:    Vitals:   04/02/22 0950  BP: (!) 143/80  Pulse: (!) 54  SpO2: 100%    Physical Exam Vitals and nursing note reviewed.  Constitutional:      Appearance: Normal appearance.  HENT:     Head: Normocephalic and atraumatic.  Cardiovascular:     Rate and Rhythm: Normal rate.     Pulses: Normal pulses.  Abdominal:     General: There is no distension.     Palpations: Abdomen is soft.     Tenderness: There is no abdominal tenderness.  Skin:    General: Skin is warm.     Capillary Refill: Capillary refill takes less than 2 seconds.  Neurological:     Mental Status: She is alert and oriented to person, place, and time.  Psychiatric:        Mood and Affect: Mood normal.        Behavior: Behavior normal.        Thought Content: Thought content normal.        Judgment: Judgment normal.     Assessment & Plan:  Chronic bilateral thoracic back pain  Neck pain  Symptomatic mammary hypertrophy  Panniculitis  The patient is a good candidate for a panniculectomy and abdominoplasty.  Patient also requested  a quote for abdominoplasty and thigh lift which would be separate procedures.   Pictures were obtained of the patient and placed in the chart with the patient's or guardian's permission.   [place], DO

## 2022-04-15 ENCOUNTER — Telehealth: Payer: Self-pay | Admitting: Plastic Surgery

## 2022-04-22 ENCOUNTER — Encounter: Payer: Self-pay | Admitting: *Deleted

## 2022-04-30 DIAGNOSIS — H938X2 Other specified disorders of left ear: Secondary | ICD-10-CM | POA: Diagnosis not present

## 2022-05-15 DIAGNOSIS — H6502 Acute serous otitis media, left ear: Secondary | ICD-10-CM | POA: Diagnosis not present

## 2022-05-15 DIAGNOSIS — J324 Chronic pansinusitis: Secondary | ICD-10-CM | POA: Diagnosis not present

## 2022-05-17 DIAGNOSIS — R509 Fever, unspecified: Secondary | ICD-10-CM | POA: Diagnosis not present

## 2022-05-17 DIAGNOSIS — R110 Nausea: Secondary | ICD-10-CM | POA: Diagnosis not present

## 2022-05-17 DIAGNOSIS — N3090 Cystitis, unspecified without hematuria: Secondary | ICD-10-CM | POA: Diagnosis not present

## 2022-06-03 ENCOUNTER — Encounter: Payer: BC Managed Care – PPO | Admitting: Physician Assistant

## 2022-06-04 DIAGNOSIS — M25561 Pain in right knee: Secondary | ICD-10-CM | POA: Diagnosis not present

## 2022-06-25 ENCOUNTER — Encounter: Payer: BC Managed Care – PPO | Admitting: Physician Assistant

## 2022-07-04 DIAGNOSIS — K912 Postsurgical malabsorption, not elsewhere classified: Secondary | ICD-10-CM | POA: Diagnosis not present

## 2022-07-04 DIAGNOSIS — Z9884 Bariatric surgery status: Secondary | ICD-10-CM | POA: Diagnosis not present

## 2022-07-18 DIAGNOSIS — Z9884 Bariatric surgery status: Secondary | ICD-10-CM | POA: Diagnosis not present

## 2022-07-18 DIAGNOSIS — K909 Intestinal malabsorption, unspecified: Secondary | ICD-10-CM | POA: Diagnosis not present

## 2022-07-29 NOTE — Progress Notes (Signed)
Patient ID: Carrie Brooks, female    DOB: [birth year], 56 y.o.   MRN: [id number]  Chief Complaint  Patient presents with   Pre-op Exam      ICD-10-CM   1. Symptomatic mammary hypertrophy  N62        History of Present Illness: Carrie Brooks is a 56 y.o.  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  She presents for preoperative evaluation for upcoming procedure, panniculectomy, scheduled for 08/12/2022 with Dr. Marla Roe.  The patient has not had problems with anesthesia.  She reports a remote history of "shaking" for a prolonged period time following anesthesia, but did not experience similar postoperative reactions with subsequent surgeries.  She does take elderberry and other vitamins/supplements which she will hold 1 week prior to surgery.  She denies any personal history of MI, CVA, or cancer.  No personal or family history of blood clots or clotting disorder.  She is not on any birth control or hormone replacement.  Her only scheduled medications are antihistamines and vitamins.  After discussion of the planned panniculectomy, she expresses that she would prefer abdominoplasty rather than panniculectomy.  She reports that her umbilicus is already low and she would not want to move any lower with resection of pannus.  She would prefer an upper abdominal add-on with transposition of umbilicus.  She does not want plication of the rectus muscles.  She reports that she had discussed possible Fleur-de-Lis incision with Dr. Marla Roe, but will clarify with her over telephone encounter next week before surgery.  She endorses 135 pound weight loss since her bariatric surgery.  She endorses allergies to penicillins and Keflex.  Summary of Previous Visit: She was seen by Dr. Marla Roe for consult on 04/02/2022.  At that time, complained of upper back pain as well as infra pannus rashes in the context of extreme weight loss after bariatric surgery 2021.  She also has a  history of cholecystectomy and tubal ligation.  She expressed interest in panniculectomy and/or abdominoplasty.  She also expressed interest in a thigh lift.  Insurance authorization was submitted for panniculectomy and she was also provided quotes for abdominoplasty and thigh left as separate procedures.  She then communicated to our clinic 04/22/2022 that she would just like to go with the lower stomach at this time.    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  PMH Significant for: Bariatric surgery 2021, extreme weight loss, panniculitis, tubal ligation, cholecystectomy.   Past Medical History: Allergies: Allergies  Allergen Reactions   Keflex [Cephalexin] Shortness Of Breath   Penicillins Hives    Current Medications:  Current Outpatient Medications:    cetirizine (ZYRTEC) 10 MG tablet, Take 10 mg by mouth every morning. , Disp: , Rfl:    Cholecalciferol 1.25 MG (50000 UT) TABS, Take by mouth., Disp: , Rfl:    MULTIPLE VITAMIN-FOLIC ACID PO, Take by mouth., Disp: , Rfl:   Past Medical Problems: Past Medical History:  Diagnosis Date   Complication of anesthesia    patient having shaking for 2 months after ablation surgery in 2009    Dysrhythmia    tachycardia, palpitations    GERD (gastroesophageal reflux disease)    Hyperlipidemia    Hypertension     Past Surgical History: Past Surgical History:  Procedure Laterality Date   BLADDER SUSPENSION  2009   uterine ablation   CARPAL TUNNEL RELEASE Bilateral 2004 & 2005   gallbladder  1990   transulnar nerve transposition  2002 adn 2003    bilateral    TUBAL LIGATION      Social History: Social History   Socioeconomic History   Marital status: Married    Spouse name: Not on file   Number of children: Not on file   Years of education: Not on file   Highest education level: Not on file  Occupational History   Not on file  Tobacco Use   Smoking status: Never   Smokeless tobacco: Never  Substance  and Sexual Activity   Alcohol use: No   Drug use: No   Sexual activity: Not on file  Other Topics Concern   Not on file  Social History Narrative   Not on file   Social Determinants of Health   Financial Resource Strain: Not on file  Food Insecurity: Not on file  Transportation Needs: Not on file  Physical Activity: Not on file  Stress: Not on file  Social Connections: Not on file  Intimate Partner Violence: Not on file    Family History: Family History  Problem Relation Age of Onset   Cancer Maternal Grandmother        breast   Asthma Other    Hypertension Other    Hyperlipidemia Other    Obesity Other     Review of Systems: ROS She denies any chest pain, difficulty breathing, fevers, leg swelling, or recent hospitalization.  Physical Exam: Vital Signs BP (!) 145/84 (BP Location: Right Arm, Patient Position: Sitting, Cuff Size: Small)   Pulse (!) 51   Ht 5\' 2"  (1.575 m)   Wt 135 lb 6.4 oz (61.4 kg)   SpO2 100%   BMI 24.76 kg/m   Physical Exam Constitutional:      General: Not in acute distress.    Appearance: Normal appearance. Not ill-appearing.  HENT:     Head: Normocephalic and atraumatic.  Eyes:     Pupils: Pupils are equal, round. Cardiovascular:     Rate and Rhythm: Normal rate.    Pulses: Normal pulses.  Pulmonary:     Effort: No respiratory distress or increased work of breathing.  Speaks in full sentences. Abdominal:     General: Abdomen is flat. No distension.   Musculoskeletal: Normal range of motion. No lower extremity swelling or edema. No varicosities. Skin:    General: Skin is warm and dry.     Findings: No erythema or rash.  Neurological:     Mental Status: Alert and oriented to person, place, and time.  Psychiatric:        Mood and Affect: Mood normal.        Behavior: Behavior normal.    Assessment/Plan: The patient is scheduled for panniculectomy with Dr. Marla Roe.  Risks, benefits, and alternatives of procedure discussed,  questions answered and consent obtained.    Smoking Status: Non-smoker.  Caprini Score: 3; Risk Factors include: Age and length of planned surgery. Recommendation for mechanical prophylaxis. Encourage early ambulation.   Pictures obtained: 04/02/2022.  Post-op Rx sent to pharmacy: Clindamycin, oxycodone, Zofran.  Patient was provided with the General Surgical Risk consent document and Pain Medication Agreement prior to their appointment.  They had adequate time to read through the risk consent documents and Pain Medication Agreement. We also discussed them in person together during this preop appointment. All of their questions were answered to their satisfaction.  Recommended calling if they have any further questions.  Risk consent form and Pain Medication Agreement to be scanned into patient's chart.  The  risk that can be encountered for this procedure were discussed and include the following but not limited to these: asymmetry, fluid accumulation, firmness of the tissue, skin loss, decrease or no sensation, fat necrosis, bleeding, infection, healing delay.  Deep vein thrombosis, cardiac and pulmonary complications are risks to any procedure.  There are risks of anesthesia, changes to skin sensation and injury to nerves or blood vessels.  The muscle can be temporarily or permanently injured.  You may have an allergic reaction to tape, suture, glue, blood products which can result in skin discoloration, swelling, pain, skin lesions, poor healing.  Any of these can lead to the need for revisonal surgery or stage procedures.  Weight gain and weigh loss can also effect the long term appearance. The results are not guaranteed to last a lifetime.  Future surgery may be required.      Electronically signed by: Krista Blue, PA-C 07/31/2022 11:28 AM

## 2022-07-30 ENCOUNTER — Encounter: Payer: BC Managed Care – PPO | Admitting: Physician Assistant

## 2022-07-31 ENCOUNTER — Ambulatory Visit (INDEPENDENT_AMBULATORY_CARE_PROVIDER_SITE_OTHER): Payer: BC Managed Care – PPO | Admitting: Physician Assistant

## 2022-07-31 VITALS — BP 145/84 | HR 51 | Ht 62.0 in | Wt 135.4 lb

## 2022-07-31 DIAGNOSIS — N62 Hypertrophy of breast: Secondary | ICD-10-CM

## 2022-07-31 MED ORDER — CLINDAMYCIN HCL 150 MG PO CAPS: 450.0000 mg | ORAL_CAPSULE | Freq: Three times a day (TID) | ORAL | 0 refills | Status: AC

## 2022-07-31 MED ORDER — ONDANSETRON 4 MG PO TBDP: 4.0000 mg | ORAL_TABLET | Freq: Three times a day (TID) | ORAL | 0 refills | Status: DC | PRN

## 2022-07-31 MED ORDER — OXYCODONE HCL 5 MG PO TABS: 5.0000 mg | ORAL_TABLET | Freq: Four times a day (QID) | ORAL | 0 refills | Status: AC | PRN

## 2022-08-05 ENCOUNTER — Encounter: Payer: Self-pay | Admitting: Plastic Surgery

## 2022-08-05 ENCOUNTER — Ambulatory Visit (INDEPENDENT_AMBULATORY_CARE_PROVIDER_SITE_OTHER): Payer: BC Managed Care – PPO | Admitting: Plastic Surgery

## 2022-08-05 DIAGNOSIS — G8929 Other chronic pain: Secondary | ICD-10-CM

## 2022-08-05 DIAGNOSIS — R21 Rash and other nonspecific skin eruption: Secondary | ICD-10-CM

## 2022-08-05 DIAGNOSIS — M793 Panniculitis, unspecified: Secondary | ICD-10-CM

## 2022-08-05 DIAGNOSIS — M549 Dorsalgia, unspecified: Secondary | ICD-10-CM

## 2022-08-05 NOTE — Progress Notes (Signed)
   Subjective:    Patient ID: Carrie Brooks, female    DOB: [birth year], 56 y.o.   MRN: [id number]  The patient is a 56 year old female joining me by phone for further discussion about her abdominal surgery.  She is interested in getting her abdominal area as tight as possible and does not want her umbilicus displaced inferiorly.  She is concerned that a panniculectomy may do that.  It is unlikely that it will displace the umbilicus in any significant way since a panniculectomy does not excise around the umbilicus.  However she would still like as much skin removed as possible would like to see about getting an abdominoplasty.  She complains of back pain and rashes in her skin folds of the abdomen.  She had bariatric surgery in 2021 and lost over 100 pounds.     Review of Systems     Objective:   Physical Exam      Assessment & Plan:     ICD-10-CM   1. Panniculitis  M79.3     2. Chronic bilateral thoracic back pain  M54.6    G89.29       I connected with  Carrie Brooks on 08/05/22 by phone and verified that I am speaking with the correct person using two identifiers. The patient was at home and I was at the office.  We spent 10 minutes in discussion.  Patient would like to add on the upper abdomen with the panniculectomy.   I discussed the limitations of evaluation and management by telemedicine. The patient expressed understanding and agreed to proceed.

## 2022-08-06 DIAGNOSIS — E785 Hyperlipidemia, unspecified: Secondary | ICD-10-CM | POA: Diagnosis not present

## 2022-08-06 DIAGNOSIS — Z6823 Body mass index (BMI) 23.0-23.9, adult: Secondary | ICD-10-CM | POA: Diagnosis not present

## 2022-08-06 DIAGNOSIS — Z0000 Encounter for general adult medical examination without abnormal findings: Secondary | ICD-10-CM | POA: Diagnosis not present

## 2022-08-06 DIAGNOSIS — N951 Menopausal and female climacteric states: Secondary | ICD-10-CM | POA: Diagnosis not present

## 2022-08-06 DIAGNOSIS — E559 Vitamin D deficiency, unspecified: Secondary | ICD-10-CM | POA: Diagnosis not present

## 2022-08-07 ENCOUNTER — Telehealth: Payer: Self-pay | Admitting: *Deleted

## 2022-08-07 NOTE — Telephone Encounter (Signed)
Call placed to Ms. Dory in response to her question about surgery quote. Explained that $2100 was due to [facility] before surgery to cover cosmetic portion (upper abdomen)- which she has paid. There is a $1500 fee due to [facility] and $900 due to [facility] anesthesia to cover the cosmetic portion of the surgery. Any additional amount due will be based on her insurance coverage. Any questions about [facility] charges will have to be directed to them. She verbalized understanding

## 2022-08-12 DIAGNOSIS — Z411 Encounter for cosmetic surgery: Secondary | ICD-10-CM | POA: Diagnosis not present

## 2022-08-12 DIAGNOSIS — M793 Panniculitis, unspecified: Secondary | ICD-10-CM | POA: Diagnosis not present

## 2022-08-16 ENCOUNTER — Ambulatory Visit (INDEPENDENT_AMBULATORY_CARE_PROVIDER_SITE_OTHER): Payer: BC Managed Care – PPO | Admitting: Physician Assistant

## 2022-08-16 DIAGNOSIS — Z9889 Other specified postprocedural states: Secondary | ICD-10-CM

## 2022-08-16 NOTE — Progress Notes (Cosign Needed Addendum)
Patient is a 56 year old female with PMH of panniculitis s/p panniculectomy with upper abdominal liposuction and transition of umbilicus performed 73/71/0626 Dr. Marla Roe who presents to clinic for postoperative follow-up.  Today, patient is doing well.  She is accompanied by her husband at bedside.  She reports that she has a little bit of discomfort at the drain tube insertion sites bilaterally.  She also states that occasionally she will have a "burning" sensation that is fleeting, usually resolved after a minute or two.  She has been managing her pain symptoms with Tylenol alone, she has not even required any medication today.  She feels as though she is doing well.  She has been ambulatory.  Denies any chest pain, fevers, leg swelling, or other symptoms.  Drain output has been 45 cc/day from the left side and approximately 15 to 20 cc/day from the right side.  Physical exam is entirely reassuring.  There is some ecchymoses centrally, but her abdomen is soft, nondistended.  No areas of firmness.  No asymmetries.  No concern for seroma or hematoma.  Abdomen nontender.  JP drains intact and functional, normal-appearing output in bulbs.  Discussed removing right-sided drain today, but patient admittedly is a bit queasy and is in no rush to have it removed.  We will plan for her to return in 1 week for likely drain removal as well as removal of her Aquacel Ag dressings that remain firmly intact.  No pictures obtained today, but will obtain photos at subsequent encounter.  She has been wearing her abdominal binder 24/7.  Encouraged her to continue wearing it for another day, but then she can transition to her compressive spanks.  Continue with TopiFoam and ABD pads for comfort.

## 2022-08-21 ENCOUNTER — Telehealth: Payer: Self-pay

## 2022-08-21 ENCOUNTER — Encounter: Payer: Self-pay | Admitting: Plastic Surgery

## 2022-08-21 NOTE — Telephone Encounter (Signed)
Spoke to [place], [place] by phone and she confirmed that my recommendations were fine. Adv for pt to call tomorrow morning to get appt moved up if bleeding increased but she was fine to wait until Friday to be seen.   I called pt to adv and she conveyed understanding.

## 2022-08-21 NOTE — Telephone Encounter (Signed)
Pt called in stating that the stitch that connects one of her drains to her has come out. She stated that she sent a MyChart message to Dr. Marla Roe earlier but had not received a response yet. I adv pt to secure the drain to herself with medical tape and if it comes out she can apply vaseline and gauze to the drain site. She stated she is also bleeding at the incision. She stated that at one point it was one streak of blood but it has now grown. I adv her that bleeding at the incision site is normal but it should still be reported because it depends on how much she's bleeding and color. I adv not to take any of the bandages off and to apply gauze or abdominal pad. She stated she has a post op appt this Friday, 11/3 and wanted to know if it needed to be moved up. I adv that I would let Lyndee Leo and Dr. D know and one of them could adv. I informed her that Lyndee Leo was in office tomorrow and to call the office back tonight if her drain comes out or if the bleeding significantly increases.   I adv that I would inform Lyndee Leo and Dr. Marla Roe and they would either confirm or veto what I recommended. Pt conveyed understanding.   Please advise.

## 2022-08-21 NOTE — Telephone Encounter (Signed)
I called the patient. She states that her right drain suture has come out. She states the bulb has been holding suction but the drain has been putting out very minimal drainage. I instructed the patient to reinforce it with tape. She acknowledged.   Patient also reports she noticed some blood underneath Aquacel dressings. She states it has seeped outside of the dressings. She denies any fevers, chills, nausea, or vomiting. She denies any lightheadedness or dizziness. I discussed with the patient to continue to monitor for now and to continue to wear compression at all times. I discussed with the patient that if she feels it worsens she will need to call us back. Pt expressed understanding. I discussed with the patient that if she has concerns or feels the drainage underneath her dressing worsens that we can evaluate her in the clinic tomorrow. Otherwise patient can be seen in clinic on Friday for her scheduled appointment. Patient expressed understanding and was in agreement with the plan.

## 2022-08-22 ENCOUNTER — Telehealth: Payer: Self-pay | Admitting: *Deleted

## 2022-08-22 ENCOUNTER — Ambulatory Visit (INDEPENDENT_AMBULATORY_CARE_PROVIDER_SITE_OTHER): Payer: BC Managed Care – PPO | Admitting: Student

## 2022-08-22 ENCOUNTER — Encounter: Payer: Self-pay | Admitting: Student

## 2022-08-22 DIAGNOSIS — Z9889 Other specified postprocedural states: Secondary | ICD-10-CM

## 2022-08-22 NOTE — Progress Notes (Deleted)
Patient is a 56 year old female with PMH of panniculitis s/p panniculectomy with upper abdominal liposuction and transition of umbilicus performed 37/48/2707 Dr. Marla Roe who presents to clinic for postoperative follow-up.   She was last seen here in clinic on 08/16/2022.  At that time, drain output had been approximately 45 cc/day from the left side and 20 cc/day from the right side.  Physical exam is entirely reassuring.  Abdomen was soft, nondistended.  No firmness.  No evidence concerning for seroma or hematoma.  Discussed removal of right-sided drain, but plan was for her to have removed at subsequent appointment along with her Aquacel Ag dressings that had remained firmly intact.  We will also obtain postoperative photos.  Today,

## 2022-08-22 NOTE — Telephone Encounter (Signed)
Faxed order,demographics,insurance information, and recent office notes to [facility] for supplies for the patient.  Confirmation received and copy scanned into the chart.//AB/CMA

## 2022-08-22 NOTE — Progress Notes (Signed)
Patient is a 56 year old female who underwent panniculectomy with upper abdominal liposuction and transition of the umbilicus by Dr. Marla Roe on 08/12/2022.  Patient presents to the clinic today with concerns about her right drain and about some drainage she is having underneath the dressings.  Patient was last seen in the clinic on 08/16/2022.  At this visit, she was doing well.  Her drain output at that time had been 45 cc/day from the left drain and 1520 cc from the right drain.  Her physical exam was entirely reassuring.  Plan is for her to return in 1 week to remove the drains and to take off the Aquacel dressings.  Today patient is accompanied by her mother at bedside.  Patient reports that she has been doing okay.  She states that she sometimes has a little bit of a burning sensation to the incision but states it is intermittent.  She denies any fevers or chills.  She reports the right drain has put out about 30 cc in 36 hours and if the left drain put about 50 cc out yesterday.  She reports she still can see some drainage underneath one of the clear dressings to her incision but it has not worsened since yesterday.  Chaperone present on exam.  On exam, patient is sitting upright in no acute distress.  Abdomen is soft and nontender to palpation.  There is no overlying erythema or ecchymosis.  Xeroform to the umbilicus was removed.  Umbilicus appears viable with some scabbing around the periphery.  Aquacel dressings are in place over the incision.  These were removed.  There is an approximately 4 cm x 1.5 cm x 0.25 cm superficial wound with overlying superficial skin sloughing /necrosis noted just right of midline.  This area appears to be draining some old blood onto the dressings.  There is no active bleeding noted at time of exam.  There is also a 3 cm x 1 cm area of possible superficial skin necrosis noted just left of midline.  There is no surrounding erythema to either area.  There is no sign of  infection.  Incision is otherwise intact.  JP drains are in place bilaterally.  There is serosanguineous drainage noted to both bulbs.  The right drain was removed without difficulty.  Patient tolerated well.  Vaseline and gauze were placed over the drain site.  I discussed with the patient that she should apply Vaseline and gauze daily to her drain site in her umbilicus.  I discussed with the patient that she should apply Xeroform, gauze and tape to the medial aspect of her incision where the wound is.  Patient expressed understanding.  [facility] order will be placed for Xeroform, gauze and tape.  I discussed with the patient that she should wear compression at all times and avoid strenuous activities.  I discussed with her that she can apply ABD pads over the incision if she would like as a layer of protection between the incision and the binder or spanks.  I discussed with the patient that we will leave her left drain in for now and we can plan to remove early next week if the drain output is low enough.  Patient expressed understanding.  Patient to follow-up on Monday for reevaluation and possible drain removal.  I instructed the patient to call if she has any questions or concerns in the meantime.

## 2022-08-23 ENCOUNTER — Encounter: Payer: BC Managed Care – PPO | Admitting: Physician Assistant

## 2022-08-23 ENCOUNTER — Telehealth: Payer: Self-pay | Admitting: *Deleted

## 2022-08-23 ENCOUNTER — Encounter: Payer: BC Managed Care – PPO | Admitting: Plastic Surgery

## 2022-08-23 NOTE — Telephone Encounter (Signed)
Received on (08/22/22) via of fax Order Status Notification from [facility].   Stating:[facility] will be contacting the patient with pricing options.  Their deductible has not yet been satisfied.  Thank you for your patience!//AB/CMA

## 2022-08-26 ENCOUNTER — Ambulatory Visit (INDEPENDENT_AMBULATORY_CARE_PROVIDER_SITE_OTHER): Payer: BC Managed Care – PPO | Admitting: Student

## 2022-08-26 DIAGNOSIS — Z9889 Other specified postprocedural states: Secondary | ICD-10-CM

## 2022-08-26 NOTE — Progress Notes (Signed)
Patient is a 56 year old female who underwent panniculectomy with upper abdominal liposuction and transition of the umbilicus by Dr. Marla Roe on 08/12/2022.  Patient presents to the clinic today for postoperative follow-up.     Patient was last seen in the clinic on 08/22/2022.  At this visit, she had some concerns about some drainage that was underneath her incision.  Her right drain was putting out very minimal and her left drain was still putting out 50 cc in 24 hours.  On exam, her umbilicus appears to be viable.  To the abdominal incision, there is an approximately 4 cm x 1.5 cm x 0.25 cm superficial wound and a 3 cm x 1 cm area of possible superficial skin necrosis noted to the midline of the incision.  The right drain was removed at this visit.  Plan was for patient to apply Xeroform daily to her wounds and to follow-up earlier the following week for possible drain removal.  Today patient reports she is doing okay.  She states that her drain has still been putting out about 50 to 60 cc/day.  She reports that she tried to use compressive spanks, but felt she was more swollen after the use of this being so she switched back to her binder.  She states she has another pair of compression spanks that she wants to try after her drain is removed.  Patient denies any fevers or chills.  Chaperone present on exam.  On exam, patient is sitting upright in no acute distress.  Abdomen is soft and nontender.  There are no significant fluid collections palpated on exam.  Umbilicus appears viable.  There is a wound noted to just the right of midline which appears to be very superficial and slightly improved from previous exam. There appears to be some superficial skin tearing or superficial skin necrosis to the area left of midline. This appears similar to previous exam.  There is no surrounding erythema, drainage or swelling noted to the areas.  There are no signs of infection on exam.  Abdominal incision is  otherwise intact.  Previous right drain site is healing well.  Left drain is in place and functioning with serosanguineous drainage in the bulb.  I discussed with the patient that she should continue with her abdominal binder for now for compression at all times.  I discussed with the patient that she should continue the Xeroform daily to the midline of the inferior abdominal incision.  I discussed that she should continue Vaseline to the umbilicus and the previous right drain site.  I discussed with the patient that it is too early to pull the drain given the amount of output she is having.  We will plan to see the patient back in 1 week, but I told the patient that if her drainage is consistently below 30 cc before the end of the week she can call and let us know and we can possibly remove it then.  Patient expressed understanding.  I discussed with the patient to call in the meantime if she has any questions or concerns.

## 2022-08-28 ENCOUNTER — Telehealth: Payer: Self-pay | Admitting: Student

## 2022-08-28 NOTE — Telephone Encounter (Signed)
I spoke with the patient regarding her MyChart message.  Patient expresses that she has concerns about the drain site as it has become red and is irritating her.  She states that her drain has put out approximately 30 cc in the past 24 hours.  I reviewed the picture in the patient's chart.  There appears to be some irritation at the drain site.  From what I can tell in the picture it does not look infected.    I discussed with the patient that she can apply Vaseline daily around the drain site and apply gauze daily if she would like for some padding near the drain site.  I discussed with the patient to continue to monitor the area.  I discussed with her that if it becomes more red or more painful that she should let us know.  I also discussed with the patient that if her drain output continues to decrease to let us know and we can possibly remove the drain.  patient expressed understanding.

## 2022-09-02 ENCOUNTER — Encounter: Payer: Self-pay | Admitting: Physician Assistant

## 2022-09-02 ENCOUNTER — Encounter: Payer: BC Managed Care – PPO | Admitting: Student

## 2022-09-02 ENCOUNTER — Ambulatory Visit (INDEPENDENT_AMBULATORY_CARE_PROVIDER_SITE_OTHER): Payer: BC Managed Care – PPO | Admitting: Physician Assistant

## 2022-09-02 VITALS — BP 143/84

## 2022-09-02 DIAGNOSIS — Z9889 Other specified postprocedural states: Secondary | ICD-10-CM

## 2022-09-02 MED ORDER — DOXYCYCLINE HYCLATE 100 MG PO TABS: 100.0000 mg | ORAL_TABLET | Freq: Two times a day (BID) | ORAL | 0 refills | Status: AC

## 2022-09-02 NOTE — Progress Notes (Signed)
Patient is a 56 year old female with PMH of panniculitis s/p panniculectomy with upper abdominal liposuction and transition of umbilicus performed 08/12/2022 Dr. Ulice Bold who presents to clinic for postoperative follow-up.  She was last seen here in clinic on 08/26/2022.  At that time, her left drain had continued to put out 50 to 60 cc/day.  She had been wearing compressive spanks daily but then switched back to her binder due to discomfort.  On exam, abdomen is soft, no seroma or hematoma appreciated.  Small wound just right of the midline that appeared improved compared to prior encounter.  Otherwise, exam entirely reassuring.  Plan was for continued abdominal binder and Xeroform daily to the midline of abdominal incision.  Continued Vaseline to umbilicus.  Plan for possible drain removal in 1 week when volumes are reduced.  Patient called into the on-call service over the weekend complaining that her drain had self extricated.  She tells me that the sutures were no longer secured to the skin and at the entire tube self-extricated yesterday after the phone call.  She has been applying gauze as needed for drainage.  She tells me that she started having worsening discomfort over the midline of abdominoplasty incision over the weekend.  She thought that perhaps it was may be due to overexertion, but she is concerned for infection.  Denies any fevers or other systemic symptoms.  On physical exam, abdomen is soft.  Umbilicus appears to be healing quite well.  The abdominoplasty incision is healing well over lateral aspect, but she does have a couple incisional wounds with overlying eschar with surrounding erythema that is firm and tender to palpation.  No drainage.  Drain tube insertion site does not appear to be irritated.  Suspect that the 2 x 0.5 cm area of incisional slough (left of midline) will lead to small opening.  The 2 x 1 cm area of eschar (right of midline) will likely experience a similar  course.  She was informed of these expectations.  However, suspect that they will be superficial and ultimately heal well.  While there is no obvious infection on exam, given the mild surrounding redness that is tender to palpation and firm, in conjunction of her history of new onset incisional discomfort), will cover with doxycycline x7 days and reassess in 1 week.  In the interim, recommending thin film of Vaseline around the umbilicus as well as Vaseline to the entire length of her abdominal incision.  She can then dress the 2 areas of concern at midline with Xeroform followed by ABD pad.  Also encouraged her to place gauze over the drain tube insertion site for drainage as needed.  Picture(s) obtained of the patient and placed in the chart were with the patient's or guardian's permission.

## 2022-09-06 NOTE — Progress Notes (Signed)
Patient is a 56 year old female with PMH of panniculitis s/p panniculectomy with upper abdominal liposuction and transition of umbilicus performed 08/12/2022 Dr. Ulice Bold who presents to clinic for postoperative follow-up.   She was last seen here in clinic on 09/02/2022.  At that time, her drain had self extricated and she was having worsening discomfort over the midline of the abdominoplasty incision.  She was concern for infection.  Exam was reassuring, but she did have a couple areas of concern.  There was a 2 x 0.5 cm area of incisional slough left of midline that would likely lead to small opening.  The 2 x 1 cm area of eschar right at the midline may experience a similar course.  Suspect however that they are each superficial and will ultimately heal without complication or difficulty.  She was prescribed doxycycline x7 days given her new onset worsening discomfort at midline in conjunction with the new onset redness.  Vaseline and Xeroform for incisional wound management.  ABD pads as needed for drainage.  Today, patient reports that she has not seen any considerable healing since last encounter.  She states that the incisional wounds continue to drain a yellowish drainage.  She also tells me that she thought that she would have less redundant skin and upper abdomen if having the abdominoplasty rather than the panniculectomy.  Patient reports that her bellybutton was an "innie" preoperatively and is displeased with the outward projection.  She also reports that she cannot sleep on her back any longer as she is typically a side or stomach sleeper.  She has not been getting adequate rest at home.  She has been dressing her wounds with Xeroform followed by adhesive bandage.  She does tell me that since taking the doxycycline, the redness around the wound has improved and the area is much less tender.  On physical exam, her 2 incisional wounds remain.  The one on the right side is 3 x 0.75 cm, 0.25 cm  depth.  The one to the left is 2 x 0.5 cm, 0.25 cm depth.  Slough noted centrally in each wound.  No surrounding erythema or tenderness.  Abdomen is soft, nondistended.  A few scattered sutures are removed without complication or difficulty.  Umbilicus is viable, appears well-healed.  Will prescribe patient Santyl for her to incisional wounds to allow for enzymatic debridement rather than mechanical debridement here in clinic of her incisional slough.  Suspect that these wounds are largely superficial and will heal without complication or difficulty.  As for her concerns regarding abdominal contour and umbilicus, will defer to follow-up with the surgeon.  Informed patient that she is still early in her postoperative recovery.   Can discuss scar treatments at subsequent encounter.  Picture(s) obtained of the patient and placed in the chart were with the patient's or guardian's permission.

## 2022-09-09 ENCOUNTER — Ambulatory Visit (INDEPENDENT_AMBULATORY_CARE_PROVIDER_SITE_OTHER): Payer: BC Managed Care – PPO | Admitting: Physician Assistant

## 2022-09-09 ENCOUNTER — Telehealth: Payer: Self-pay | Admitting: Physician Assistant

## 2022-09-09 DIAGNOSIS — Z9889 Other specified postprocedural states: Secondary | ICD-10-CM

## 2022-09-09 MED ORDER — SANTYL 250 UNIT/GM EX OINT: 1.0000 | TOPICAL_OINTMENT | Freq: Every day | CUTANEOUS | 0 refills | Status: DC

## 2022-09-09 NOTE — Telephone Encounter (Signed)
Pharmacy has not filled medication. Pt stated that the pharmacy is waiting on a call back from the provider.

## 2022-09-09 NOTE — Telephone Encounter (Signed)
Pt stated that if the provider was not able to call the pharmacy back by 6pm they could not fill the medication for two more days

## 2022-09-10 NOTE — Telephone Encounter (Signed)
I spoke with the pharmacy this morning. They are placing the order now and they tell me that she should be able to pick it up tomorrow, 09/11/22.  I spoke with patient and notified her that she can continue with xeroform in interim.

## 2022-09-26 DIAGNOSIS — J0100 Acute maxillary sinusitis, unspecified: Secondary | ICD-10-CM | POA: Diagnosis not present

## 2022-10-02 DIAGNOSIS — Z780 Asymptomatic menopausal state: Secondary | ICD-10-CM | POA: Diagnosis not present

## 2022-10-02 DIAGNOSIS — J329 Chronic sinusitis, unspecified: Secondary | ICD-10-CM | POA: Diagnosis not present

## 2022-10-02 DIAGNOSIS — Z6823 Body mass index (BMI) 23.0-23.9, adult: Secondary | ICD-10-CM | POA: Diagnosis not present

## 2022-10-02 DIAGNOSIS — H6091 Unspecified otitis externa, right ear: Secondary | ICD-10-CM | POA: Diagnosis not present

## 2022-10-08 ENCOUNTER — Encounter: Payer: Self-pay | Admitting: Plastic Surgery

## 2022-10-08 ENCOUNTER — Ambulatory Visit (INDEPENDENT_AMBULATORY_CARE_PROVIDER_SITE_OTHER): Payer: BC Managed Care – PPO | Admitting: Plastic Surgery

## 2022-10-08 DIAGNOSIS — Z1231 Encounter for screening mammogram for malignant neoplasm of breast: Secondary | ICD-10-CM | POA: Diagnosis not present

## 2022-10-08 DIAGNOSIS — M793 Panniculitis, unspecified: Secondary | ICD-10-CM

## 2022-10-08 NOTE — Progress Notes (Signed)
   Subjective:    Patient ID: Carrie Brooks, female    DOB: 1966/03/06, 56 y.o.   MRN: [id number]  The patient is a 56 year old female here for evaluation of her abdomen after surgery.  She underwent a panniculectomy with abdominoplasty.  Overall she is doing well.  There are 2 small areas that are about 2 to 3 mm of opening in the middle aspect of the abdominal incision.  She does have a little bit of looser soft tissues laterally.  There is no sign of infection or hematoma.  After looking at the pictures this has improved over the past month.  She is now 2 months postop.      Review of Systems  Constitutional: Negative.   Eyes: Negative.   Respiratory: Negative.    Cardiovascular: Negative.   Gastrointestinal: Negative.   Endocrine: Negative.   Genitourinary: Negative.        Objective:   Physical Exam Constitutional:      Appearance: Normal appearance.  Cardiovascular:     Rate and Rhythm: Normal rate.     Pulses: Normal pulses.  Abdominal:     General: There is no distension.     Palpations: There is no mass.     Tenderness: There is no abdominal tenderness.  Neurological:     Mental Status: She is alert and oriented to person, place, and time.  Psychiatric:        Mood and Affect: Mood normal.        Behavior: Behavior normal.        Thought Content: Thought content normal.        Judgment: Judgment normal.           Assessment & Plan:     ICD-10-CM   1. Panniculitis  M79.3        Plan for revision but I did like to see her back in a month and then we will talk more about her options.  Pictures were obtained of the patient and placed in the chart with the patient's or guardian's permission.

## 2022-11-20 DIAGNOSIS — R6882 Decreased libido: Secondary | ICD-10-CM | POA: Diagnosis not present

## 2022-11-20 DIAGNOSIS — N951 Menopausal and female climacteric states: Secondary | ICD-10-CM | POA: Diagnosis not present

## 2022-12-06 ENCOUNTER — Encounter: Payer: Self-pay | Admitting: Plastic Surgery

## 2022-12-06 ENCOUNTER — Ambulatory Visit: Payer: BC Managed Care – PPO | Admitting: Plastic Surgery

## 2022-12-06 VITALS — BP 123/79 | HR 89

## 2022-12-06 DIAGNOSIS — M793 Panniculitis, unspecified: Secondary | ICD-10-CM

## 2022-12-06 MED ORDER — VALACYCLOVIR HCL 500 MG PO TABS: 500.0000 mg | ORAL_TABLET | Freq: Two times a day (BID) | ORAL | 0 refills | Status: AC

## 2022-12-06 NOTE — Progress Notes (Signed)
   Subjective:    Patient ID: Carrie Brooks, female    DOB: [birth year], 57 y.o.   MRN: [id number]  The patient is a 57 year old female here for follow-up after undergoing panniculectomy and abdominoplasty in November.  Overall the patient is doing really well.  She has been working out and may have lost a little bit more weight.  She is pleased with her results.  She also like to see if it is possible to tighten the lateral part up a little bit that hangs over.  She stated she thinks it might be just a little bit swollen today.  She also feels a little bit of lump and tenderness on the left side at the incision.  This feels like it is probably a PDS suture that has not fully absorbed.    Review of Systems  Constitutional: Negative.   Eyes: Negative.   Respiratory: Negative.    Cardiovascular: Negative.   Gastrointestinal: Negative.   Endocrine: Negative.   Genitourinary: Negative.        Objective:   Physical Exam Constitutional:      Appearance: Normal appearance.  Cardiovascular:     Rate and Rhythm: Normal rate.     Pulses: Normal pulses.  Abdominal:     General: There is no distension.     Palpations: There is no mass.     Tenderness: There is abdominal tenderness. There is no guarding.     Hernia: No hernia is present.  Skin:    General: Skin is warm.     Capillary Refill: Capillary refill takes less than 2 seconds.     Coloration: Skin is not jaundiced.     Findings: No bruising or lesion.  Neurological:     Mental Status: She is alert.  Psychiatric:        Mood and Affect: Mood normal.        Behavior: Behavior normal.        Thought Content: Thought content normal.        Judgment: Judgment normal.           Assessment & Plan:     ICD-10-CM   1. Panniculitis  M79.3        Recommend massage to the area and have her come back in August and we can take another look.  She may want to add liposuction to the flanks as well if we return to the  [place].  Pictures were obtained of the patient and placed in the chart with the patient's or guardian's permission.

## 2022-12-06 NOTE — Addendum Note (Signed)
Addended by: Wallace Going on: 12/06/2022 02:08 PM   Modules accepted: Orders

## 2023-01-09 DIAGNOSIS — Z6825 Body mass index (BMI) 25.0-25.9, adult: Secondary | ICD-10-CM | POA: Diagnosis not present

## 2023-01-09 DIAGNOSIS — Z124 Encounter for screening for malignant neoplasm of cervix: Secondary | ICD-10-CM | POA: Diagnosis not present

## 2023-01-09 DIAGNOSIS — Z01419 Encounter for gynecological examination (general) (routine) without abnormal findings: Secondary | ICD-10-CM | POA: Diagnosis not present

## 2023-01-09 DIAGNOSIS — R6882 Decreased libido: Secondary | ICD-10-CM | POA: Diagnosis not present

## 2023-01-29 DIAGNOSIS — L03116 Cellulitis of left lower limb: Secondary | ICD-10-CM | POA: Diagnosis not present

## 2023-01-29 DIAGNOSIS — S90862A Insect bite (nonvenomous), left foot, initial encounter: Secondary | ICD-10-CM | POA: Diagnosis not present

## 2023-02-09 DIAGNOSIS — R519 Headache, unspecified: Secondary | ICD-10-CM | POA: Diagnosis not present

## 2023-02-09 DIAGNOSIS — S30861A Insect bite (nonvenomous) of abdominal wall, initial encounter: Secondary | ICD-10-CM | POA: Diagnosis not present

## 2023-02-09 DIAGNOSIS — M791 Myalgia, unspecified site: Secondary | ICD-10-CM | POA: Diagnosis not present

## 2023-02-11 DIAGNOSIS — N951 Menopausal and female climacteric states: Secondary | ICD-10-CM | POA: Diagnosis not present

## 2023-02-11 DIAGNOSIS — R6882 Decreased libido: Secondary | ICD-10-CM | POA: Diagnosis not present

## 2023-03-11 DIAGNOSIS — N951 Menopausal and female climacteric states: Secondary | ICD-10-CM | POA: Diagnosis not present

## 2023-03-11 DIAGNOSIS — R6882 Decreased libido: Secondary | ICD-10-CM | POA: Diagnosis not present

## 2023-03-31 DIAGNOSIS — L237 Allergic contact dermatitis due to plants, except food: Secondary | ICD-10-CM | POA: Diagnosis not present

## 2023-03-31 DIAGNOSIS — L299 Pruritus, unspecified: Secondary | ICD-10-CM | POA: Diagnosis not present

## 2023-05-23 ENCOUNTER — Ambulatory Visit: Payer: BC Managed Care – PPO | Admitting: Plastic Surgery

## 2023-05-23 ENCOUNTER — Encounter: Payer: Self-pay | Admitting: Plastic Surgery

## 2023-05-23 VITALS — BP 141/85 | HR 66 | Ht 62.0 in | Wt 137.4 lb

## 2023-05-23 DIAGNOSIS — M793 Panniculitis, unspecified: Secondary | ICD-10-CM

## 2023-05-23 NOTE — Progress Notes (Signed)
   Subjective:    Patient ID: Carrie Brooks, female    DOB: 1965-12-13, 57 y.o.   MRN: [id number]  The patient is a 57 year old female here for follow-up after a panniculectomy abdominoplasty in November.  She has been very diligent about working out.  She has some hang over on the sides.  This is noted in most of the pictures.      Review of Systems  Constitutional: Negative.   Eyes: Negative.   Respiratory: Negative.    Cardiovascular: Negative.   Gastrointestinal: Negative.   Genitourinary: Negative.        Objective:   Physical Exam Cardiovascular:     Rate and Rhythm: Normal rate.     Pulses: Normal pulses.  Skin:    Capillary Refill: Capillary refill takes less than 2 seconds.  Neurological:     Mental Status: She is alert and oriented to person, place, and time.  Psychiatric:        Mood and Affect: Mood normal.        Behavior: Behavior normal.        Thought Content: Thought content normal.        Judgment: Judgment normal.       Assessment & Plan:     ICD-10-CM   1. Panniculitis  M79.3       Plan for excision of excess abdominal skin one side at a time due to lidocaine limits. Pictures were obtained of the patient and placed in the chart with the patient's or guardian's permission.

## 2023-07-03 DIAGNOSIS — K909 Intestinal malabsorption, unspecified: Secondary | ICD-10-CM | POA: Diagnosis not present

## 2023-07-03 DIAGNOSIS — K912 Postsurgical malabsorption, not elsewhere classified: Secondary | ICD-10-CM | POA: Diagnosis not present

## 2023-07-18 DIAGNOSIS — Z9884 Bariatric surgery status: Secondary | ICD-10-CM | POA: Diagnosis not present

## 2023-07-18 DIAGNOSIS — Z1330 Encounter for screening examination for mental health and behavioral disorders, unspecified: Secondary | ICD-10-CM | POA: Diagnosis not present

## 2023-07-18 DIAGNOSIS — R635 Abnormal weight gain: Secondary | ICD-10-CM | POA: Diagnosis not present

## 2023-07-23 DIAGNOSIS — R051 Acute cough: Secondary | ICD-10-CM | POA: Diagnosis not present

## 2023-07-23 DIAGNOSIS — R0981 Nasal congestion: Secondary | ICD-10-CM | POA: Diagnosis not present

## 2023-07-23 DIAGNOSIS — H1031 Unspecified acute conjunctivitis, right eye: Secondary | ICD-10-CM | POA: Diagnosis not present

## 2023-08-06 DIAGNOSIS — N951 Menopausal and female climacteric states: Secondary | ICD-10-CM | POA: Diagnosis not present

## 2023-08-15 ENCOUNTER — Encounter: Payer: Self-pay | Admitting: Plastic Surgery

## 2023-09-05 ENCOUNTER — Ambulatory Visit: Payer: BC Managed Care – PPO | Admitting: Plastic Surgery

## 2023-09-05 ENCOUNTER — Encounter: Payer: Self-pay | Admitting: Plastic Surgery

## 2023-09-05 DIAGNOSIS — M793 Panniculitis, unspecified: Secondary | ICD-10-CM

## 2023-09-05 NOTE — Addendum Note (Signed)
Addended by: Drema Dallas K on: 09/05/2023 10:09 AM   Modules accepted: Orders

## 2023-09-05 NOTE — Progress Notes (Signed)
Procedure Note  Preoperative Dx: excess abdominal tissue on right  Postoperative Dx: Same  Procedure: Excision of excess abdominal tissue on right 4 x 15 cm  Anesthesia: Lidocaine 1% with 1:100,000 epinephrine   Description of Procedure: Risks and complications were explained to the patient.  Consent was confirmed and the patient understands the risks and benefits.  The potential complications and alternatives were explained and the patient consents.  The patient expressed understanding the option of not having the procedure and the risks of a scar.  Time out was called and all information was confirmed to be correct.    The area was prepped and drapped.  Lidocaine 1% with epinephrine was injected in the subcutaneous area.  After waiting several minutes for the local to take affect a #15 blade was used to excise the area in an eliptical pattern.  A 3-0 Monocryl was used to close the deep layers with simple interrupted stitches.  The skin edges were reapproximated with 4-0 Monocryl subcuticular running closure.  A dressing was applied.  The patient was given instructions on how to care for the area and a follow up appointment.  Carrie Brooks tolerated the procedure well and there were no complications.

## 2023-09-22 ENCOUNTER — Encounter: Payer: Self-pay | Admitting: Surgical

## 2023-09-22 ENCOUNTER — Ambulatory Visit (INDEPENDENT_AMBULATORY_CARE_PROVIDER_SITE_OTHER): Payer: Self-pay | Admitting: Surgical

## 2023-09-22 VITALS — BP 148/88 | HR 63 | Ht 62.0 in | Wt 141.0 lb

## 2023-09-22 DIAGNOSIS — Z9889 Other specified postprocedural states: Secondary | ICD-10-CM

## 2023-09-22 NOTE — Progress Notes (Signed)
57 year old female here for follow-up after excision of excess abdominal tissue on right side, 4 x 15 cm with Dr. Ulice Bold in the office on 09/05/2023.  Skin edges were closed with 4-0 Monocryl subcuticular.  She is just over 2 weeks postop.  She reports she is doing really well.  She reports she took off Mepilex border dressing on Friday as it got wet while showering.  She is not having any issues.  She is very happy so far.  She reports no pain.  She did report that she would like the other side completed as soon as possible, she reports she discussed this with Dr. Ulice Bold but was unable to schedule just yet.  On exam right abdominal incision is intact and healing very well.  There are some Steri-Strips still over the incision.  There is no erythema or cellulitic changes.  No subcutaneous fluid collection noted.  No tenderness noted palpation.  A/P:  Recommend removing Steri-Strips in 1 to 2 weeks.  Discussed with patient we could see her back to assist her with this, but she feels comfortable doing this on her own at home.  There is no signs of infection or concern on exam.  She would like to be scheduled for excision of the left side excess abdominal tissue as well, will message Dr. Ulice Bold to discuss timeline for scheduling.  She will follow-up at next scheduled procedure.

## 2023-11-26 ENCOUNTER — Ambulatory Visit (HOSPITAL_BASED_OUTPATIENT_CLINIC_OR_DEPARTMENT_OTHER)
Admission: RE | Admit: 2023-11-26 | Discharge: 2023-11-26 | Disposition: A | Discharge status: Home / Self Care | Payer: BC Managed Care – PPO | Source: Ambulatory Visit | Attending: Emergency Medicine | Admitting: Emergency Medicine

## 2023-11-26 ENCOUNTER — Encounter (HOSPITAL_BASED_OUTPATIENT_CLINIC_OR_DEPARTMENT_OTHER): Payer: Self-pay

## 2023-11-26 VITALS — BP 129/84 | HR 63 | Temp 98.5°F | Resp 20

## 2023-11-26 DIAGNOSIS — R519 Headache, unspecified: Secondary | ICD-10-CM

## 2023-11-26 MED ORDER — KETOROLAC TROMETHAMINE 30 MG/ML IJ SOLN
30.0000 mg | Freq: Once | INTRAMUSCULAR | Status: AC
  Administered 2023-11-26: 30 mg via INTRAMUSCULAR

## 2023-11-26 MED ORDER — CYCLOBENZAPRINE HCL 10 MG PO TABS: 10.0000 mg | ORAL_TABLET | Freq: Two times a day (BID) | ORAL | 0 refills | Status: DC | PRN

## 2023-11-26 MED ORDER — DEXAMETHASONE SODIUM PHOSPHATE 10 MG/ML IJ SOLN
10.0000 mg | Freq: Once | INTRAMUSCULAR | Status: AC
  Administered 2023-11-26: 10 mg via INTRAMUSCULAR

## 2023-11-26 NOTE — ED Triage Notes (Signed)
"  I've had bad headaches for about 2 weeks now."  States pain radiates from back of head down middle of back.  No fever. States feels "sinus pressure" today. No vomiting, no blurred vision. Speech clear and appropriate.

## 2023-11-26 NOTE — Discharge Instructions (Addendum)
 I have placed a referral to [facility] -- they will contact you to make an appointment.  The toradol  and decadron  should provide several hours of pain relief Please do not use ibuprofen/Advil or other NSAIDs for the rest of today  You can take the muscle relaxer (Flexeril ) twice daily. If the medication makes you drowsy, take only at bed time.  With severe or worsening symptoms, go to the emergency department

## 2023-11-26 NOTE — ED Provider Notes (Signed)
 [facility]    CSN: [id number] Arrival date & time: 11/26/23  1027      History   Chief Complaint Chief Complaint  Patient presents with   Headache    Headache with neck pain no injury and sinus issues - Entered by patient    HPI Carrie Brooks is a 58 y.o. female.  2 week history of daily headache Pain is posterior head and neck. Does not radiate to back or extremities. Not having numbness or weakness. Today rating pain 7/10 Denies injury, trauma, or fall.  Not having fever, chills, nausea, or vomiting. No vision changes or dizziness. Balance is normal. No sensitivity to light.   Has tried tylenol and ibu, no meds yet today  Does have history of headache although never lasting this long Thinks may be due to stress  Past Medical History:  Diagnosis Date   Complication of anesthesia    patient having shaking for 2 months after ablation surgery in 2009    Dysrhythmia    tachycardia, palpitations    GERD (gastroesophageal reflux disease)    Hyperlipidemia    Hypertension     Patient Active Problem List   Diagnosis Date Noted   Back pain 04/02/2022   Panniculitis 04/02/2022   Morbid obesity (HCC) 03/31/2014    Past Surgical History:  Procedure Laterality Date   BLADDER SUSPENSION  2009   uterine ablation   CARPAL TUNNEL RELEASE Bilateral 2004 & 2005   gallbladder  1990   transulnar nerve transposition   2002 adn 2003    bilateral    TUBAL LIGATION      OB History   No obstetric history on file.      Home Medications    Prior to Admission medications   Medication Sig Start Date End Date Taking? Authorizing Provider  cyclobenzaprine  (FLEXERIL ) 10 MG tablet Take 1 tablet (10 mg total) by mouth 2 (two) times daily as needed for muscle spasms. 11/26/23  Yes Drury Ardizzone, Asberry, PA-C  cetirizine (ZYRTEC) 10 MG tablet Take 10 mg by mouth every morning.     [provider]  Cholecalciferol 1.25 MG (50000 UT) TABS Take by mouth.    [provider]  estradiol  (VIVELLE -DOT) 0.05 MG/24HR patch Place 1 patch onto the skin 2 (two) times a week. 03/29/23   [provider]  MULTIPLE VITAMIN-FOLIC ACID  PO Take by mouth.    [provider]  ondansetron  (ZOFRAN -ODT) 4 MG disintegrating tablet Take 1 tablet (4 mg total) by mouth every 8 (eight) hours as needed for nausea or vomiting. Patient not taking: Reported on 09/22/2023 07/31/22   Landy Honora CROME, PA-C  progesterone  (PROMETRIUM ) 100 MG capsule Take 100 mg by mouth daily. 04/03/23   [provider]    Family History Family History  Problem Relation Age of Onset   Cancer Maternal Grandmother        breast   Asthma Other    Hypertension Other    Hyperlipidemia Other    Obesity Other     Social History Social History   Tobacco Use   Smoking status: Never   Smokeless tobacco: Never  Substance Use Topics   Alcohol use: No   Drug use: No     Allergies   Keflex [cephalexin] and Penicillins   Review of Systems Review of Systems  Neurological:  Positive for headaches.   Per HPI  Physical Exam Triage Vital Signs ED Triage Vitals  Encounter Vitals Group     BP  11/26/23 1123 129/84     Systolic BP Percentile --      Diastolic BP Percentile --      Pulse Rate 11/26/23 1123 63     Resp 11/26/23 1123 20     Temp 11/26/23 1123 98.5 F (36.9 C)     Temp Source 11/26/23 1123 Oral     SpO2 11/26/23 1123 97 %     Weight --      Height --      Head Circumference --      Peak Flow --      Pain Score 11/26/23 1124 7     Pain Loc --      Pain Education --      Exclude from Growth Chart --    No data found.  Updated Vital Signs BP 129/84 (BP Location: Right Arm)   Pulse 63   Temp 98.5 F (36.9 C) (Oral)   Resp 20   SpO2 97%   Visual Acuity Right Eye Distance:   Left Eye Distance:   Bilateral Distance:    Right Eye Near:   Left Eye Near:    Bilateral Near:     Physical Exam Vitals and nursing note reviewed.   Constitutional:      General: She is not in acute distress. HENT:     Head: Normocephalic and atraumatic.     Right Ear: Tympanic membrane and ear canal normal.     Left Ear: Tympanic membrane and ear canal normal.     Nose: Nose normal.     Mouth/Throat:     Mouth: Mucous membranes are moist.     Pharynx: Oropharynx is clear.  Eyes:     General: Vision grossly intact. Gaze aligned appropriately.     Extraocular Movements: Extraocular movements intact.     Right eye: Normal extraocular motion and no nystagmus.     Left eye: Normal extraocular motion and no nystagmus.     Conjunctiva/sclera: Conjunctivae normal.     Pupils: Pupils are equal, round, and reactive to light.  Neck:     Comments: Full ROM, no stiffness. No bony tenderness. Cardiovascular:     Rate and Rhythm: Normal rate and regular rhythm.     Heart sounds: Normal heart sounds.  Pulmonary:     Effort: Pulmonary effort is normal. No respiratory distress.     Breath sounds: Normal breath sounds.  Abdominal:     Palpations: Abdomen is soft.     Tenderness: There is no abdominal tenderness.  Musculoskeletal:        General: Normal range of motion.     Cervical back: Full passive range of motion without pain and normal range of motion. No rigidity or torticollis. No pain with movement or spinous process tenderness. Normal range of motion.  Neurological:     General: No focal deficit present.     Mental Status: She is alert and oriented to person, place, and time.     Cranial Nerves: Cranial nerves 2-12 are intact. No cranial nerve deficit or facial asymmetry.     Sensory: Sensation is intact.     Motor: Motor function is intact. No weakness, tremor or pronator drift.     Coordination: Coordination is intact. Romberg sign negative. Finger-Nose-Finger Test normal.     Gait: Gait is intact.     Comments: Strength and sensation intact throughout      UC Treatments / Results  Labs (all labs ordered are listed, but  only abnormal  results are displayed) Labs Reviewed - No data to display  EKG  Radiology No results found.  Procedures Procedures (including critical care time)  Medications Ordered in UC Medications  ketorolac  (TORADOL ) 30 MG/ML injection 30 mg (30 mg Intramuscular Given 11/26/23 1227)  dexamethasone  (DECADRON ) injection 10 mg (10 mg Intramuscular Given 11/26/23 1229)    Initial Impression / Assessment and Plan / UC Course  I have reviewed the triage vital signs and the nursing notes.  Pertinent labs & imaging results that were available during my care of the patient were reviewed by me and considered in my medical decision making (see chart for details).  Vitals are stable, neurologically intact. Unremarkable physical exam. IM toradol  and decadron  given  Patient with much improvement. Headache is down to 3/10 At home try flexeril  BID With 2 weeks of daily headache and over age 61, referral to neurology is placed. Will also follow up with PCP Strict ED precautions discussed  Final Clinical Impressions(s) / UC Diagnoses   Final diagnoses:  Acute nonintractable headache, unspecified headache type     Discharge Instructions      I have placed a referral to [facility] -- they will contact you to make an appointment.  The toradol  and decadron  should provide several hours of pain relief Please do not use ibuprofen/Advil or other NSAIDs for the rest of today  You can take the muscle relaxer (Flexeril ) twice daily. If the medication makes you drowsy, take only at bed time.  With severe or worsening symptoms, go to the emergency department      ED Prescriptions     Medication Sig Dispense Auth. Provider   cyclobenzaprine  (FLEXERIL ) 10 MG tablet Take 1 tablet (10 mg total) by mouth 2 (two) times daily as needed for muscle spasms. 20 tablet Jaquilla Woodroof, Asberry, PA-C      PDMP not reviewed this encounter.   Jeryl Asberry, [place] 11/26/23 1312

## 2023-12-16 ENCOUNTER — Ambulatory Visit: Payer: BC Managed Care – PPO | Admitting: Plastic Surgery

## 2024-03-02 ENCOUNTER — Ambulatory Visit: Payer: BC Managed Care – PPO | Admitting: Plastic Surgery

## 2024-03-02 ENCOUNTER — Encounter: Payer: Self-pay | Admitting: Plastic Surgery

## 2024-03-02 VITALS — BP 152/95 | HR 87

## 2024-03-02 DIAGNOSIS — M793 Panniculitis, unspecified: Secondary | ICD-10-CM

## 2024-03-02 NOTE — Progress Notes (Signed)
 Procedure Note  Preoperative Dx: Excess left abdominal soft tissue  Postoperative Dx: Same  Procedure: Excision of left excess abdominal soft tissue 2 x 10 cm  Anesthesia: Lidocaine 1% with 1:100,000 epinephrine  Procedure: Risks and complications were explained to the patient.  Consent was confirmed and the patient understands the risks and benefits.  The potential complications and alternatives were explained and the patient consents.  The patient expressed understanding the option of not having the procedure and the risks of a scar.  Time out was called and all information was confirmed to be correct.    The area was prepped and drapped.  Lidocaine 1% with epinephrine was injected in the subcutaneous area.  The 15 blade was used to excise the 2 x 10 cm area of excess tissue through the skin.  The Bovie was then used to excise it including the adipose.  Hemostasis was achieved with the electrocautery.  The deep layers were closed with 3-0 Monocryl followed by a running subcuticular 4-0 Monocryl.  Dermabond and Steri-Strips were applied.  A sterile dressing was placed.  The patient was given instructions on how to care for the area and a follow up appointment.  Carrie Brooks tolerated the procedure well and there were no complications.

## 2024-03-12 ENCOUNTER — Ambulatory Visit (INDEPENDENT_AMBULATORY_CARE_PROVIDER_SITE_OTHER): Admitting: Surgical

## 2024-03-12 DIAGNOSIS — Z9889 Other specified postprocedural states: Secondary | ICD-10-CM

## 2024-03-12 DIAGNOSIS — M793 Panniculitis, unspecified: Secondary | ICD-10-CM

## 2024-03-12 NOTE — Progress Notes (Signed)
 58 year old female here for follow-up after excision of left abdominal soft tissue with Dr. Orin Birk on 03/02/2024.  She is 10 days postop.  She is doing really well.  Steri-Strips are still in place and well adherent, reviewed procedure note, skin was closed with 3-0 Monocryl followed by a subcuticular 4-0 Monocryl.  Discussed with patient that everything appears to be healing well, there is no signs of infection or concern on exam.  Incision appears intact.  There is no surrounding erythema or cellulitic changes.  Steri-Strip was still well adherent, decision was made to leave this in place for another 1 to 2 weeks.  Discussed with patient if it begins to come off in the next week or 2, she can remove at home.  She felt comfortable with this and would like to follow-up in 2 weeks for reevaluation.  She requested appointment specifically with Dr. Orin Birk.

## 2024-03-26 ENCOUNTER — Ambulatory Visit: Admitting: Plastic Surgery

## 2024-03-26 DIAGNOSIS — M793 Panniculitis, unspecified: Secondary | ICD-10-CM

## 2024-03-26 NOTE — Progress Notes (Signed)
   Subjective:    Patient ID: Carrie Brooks, female    DOB: 09-Apr-1966, 58 y.o.   MRN: [id number]  The patient is a 58 year old female here for follow-up after undergoing excision of some excess tissue in the lateral left abdominal area.  The lateral part looks really good.  The patient is unhappy with the anterior part.  I reiterated that we can only do so much at 1 time.  She was hoping to be finished.  Her weight has changed since her original surgery which is contributing to the changes that we are seeing.  No sign of a hematoma or seroma.      Review of Systems  Constitutional: Negative.   Eyes: Negative.   Respiratory: Negative.    Cardiovascular: Negative.   Gastrointestinal: Negative.   Genitourinary: Negative.        Objective:   Physical Exam Cardiovascular:     Rate and Rhythm: Normal rate.  Musculoskeletal:        General: Swelling present.  Skin:    General: Skin is warm.     Capillary Refill: Capillary refill takes less than 2 seconds.  Neurological:     Mental Status: She is oriented to person, place, and time.  Psychiatric:        Mood and Affect: Mood normal.        Behavior: Behavior normal.        Thought Content: Thought content normal.        Judgment: Judgment normal.           Assessment & Plan:     ICD-10-CM   1. Morbid obesity (HCC)  E66.01     2. Panniculitis  M79.3        Pictures were obtained of the patient and placed in the chart with the patient's or guardian's permission.  Due to patient's weight change I recommend that she decrease it back to her sustainable weight and then let me know and we can do a reexcision of the anterior left side.

## 2024-06-29 ENCOUNTER — Ambulatory Visit (INDEPENDENT_AMBULATORY_CARE_PROVIDER_SITE_OTHER): Admitting: Plastic Surgery

## 2024-06-29 ENCOUNTER — Encounter: Payer: Self-pay | Admitting: Plastic Surgery

## 2024-06-29 DIAGNOSIS — M793 Panniculitis, unspecified: Secondary | ICD-10-CM

## 2024-06-29 NOTE — Progress Notes (Signed)
   Subjective:    Patient ID: Jon People, female    DOB: [birth year], 58 y.o.   MRN: [id number]  The patient is a 58 year old female joining me by phone.  I saw her 2 years ago in June and 2023 for her abdomen she was 5 feet tall and weighed 131 pounds she had lost 130 pounds over the past years preceding that.  She complained of back pain and rashes.  She had had a gastric sleeve and bypass surgery and her weight had been stable.  She additionally had a cholecystectomy and a tubal ligation.  She was interested in a panniculectomy and an abdominoplasty.  We spoke a few months after that and the patient said that she wanted to add the upper abdomen on to the surgery.  She had the surgery in November and then came for follow-up.  The pannus that was removed was 1400 g with additional liposuction.  After the surgery the patient did have a little bit of skin breakdown.  This was to be expected because of her previous surgeries and severe weight loss.  This was the limiting factor with taking off too much skin at 1 time because of the complications and concern of loss of tissue and wound breakdown.  This was discussed with the patient.  Looking at the pictures she does have some excess skin mostly on the lateral aspect of each side.  The challenging part is whether or not I can do this in the clinic versus in the OR.  As discussed today with the patient there is no way I can do this in the clinic all at the same time it would have to be broken up into 2 parts.  And the concerning part is whether or not I could do it with enough lidocaine to make her comfortable but not too much lidocaine to create a toxic reaction.  Unfortunately the patient states that I did not do the procedure right the first time which makes it difficult to move forward.     Review of Systems     Objective:   Physical Exam       Assessment & Plan:   I asked the patient to come in and talk because things were getting a little  heated on the phone and I do not want to do anything to upset the patient I want her to be happy and I want to help her.  I feel that I did a very good job with the situation given all of the challenges.  The patient agreed to come in and we will talk in person.  I connected with  Jon People on 06/29/24 phone and verified that I am speaking with the correct person using two identifiers.  The patient to my knowledge was at home and I was at the office.  We spent less than 5 minutes in discussion.   I discussed the limitations of evaluation and management by telemedicine. The patient expressed understanding and agreed to proceed.

## 2024-07-06 ENCOUNTER — Ambulatory Visit: Admitting: Plastic Surgery

## 2024-07-06 VITALS — BP 127/77 | HR 78 | Wt 138.0 lb

## 2024-07-06 DIAGNOSIS — M793 Panniculitis, unspecified: Secondary | ICD-10-CM

## 2024-07-06 NOTE — Progress Notes (Signed)
   Subjective:    Patient ID: Carrie Brooks, female    DOB: 1966/08/07, 58 y.o.   MRN: [id number]  The patient is a 57 year old female here for further evaluation of her abdomen.  She was first seen 2 years ago for a panniculectomy and abdominoplasty.  She had lost over 200 pounds and was down to 130 pounds.  Today she is 138 pounds.  She had a gastric sleeve bypass in 2021 her weight had been stable and she underwent a panniculectomy and abdominoplasty in late 2023.  She underwent an excision of some excess skin on each side in the office.  She has since increased her weight to 138 pounds.  I think that this is contributing to the pendulous excess laterally.  I tried to explain to her today that my goal was to keep her safe in the operating room.  I showed her the amount of tissue that was removed by a picture which was significant.  She also had a abdominal incision from a cholecystectomy that created some challenges in the OR.  Because of that scar it cut through her blood supply as well.  After the surgery she had just a little bit of breakdown at the incision site.  I think I got her any tighter it would have been markedly worse.  Now the patient wants to have a revision without paying.  If I could do it in the office I would but my concern is that I would reach my lidocaine limit.      Review of Systems     Objective:   Physical Exam      Assessment & Plan:     ICD-10-CM   1. Panniculitis  M79.3     2. Morbid obesity (HCC)  E66.01        The patient was not pleased and left the office.  I left the exam room as well as I expressed feeling attacked.  I expressed that I wanted to help her and that if she could lose the 10 pounds I think we could do this here in the office.
